# Patient Record
Sex: Male | Born: 1950 | Race: White | Hispanic: No | Marital: Married | Smoking: Never smoker
Health system: Southern US, Community
[De-identification: ages and names within clinical notes are randomized; demographics above are authoritative.]

## PROBLEM LIST (undated history)

## (undated) DIAGNOSIS — Z8719 Personal history of other diseases of the digestive system: Secondary | ICD-10-CM

## (undated) DIAGNOSIS — M1990 Unspecified osteoarthritis, unspecified site: Secondary | ICD-10-CM

## (undated) DIAGNOSIS — K219 Gastro-esophageal reflux disease without esophagitis: Secondary | ICD-10-CM

## (undated) DIAGNOSIS — K2270 Barrett's esophagus without dysplasia: Secondary | ICD-10-CM

## (undated) DIAGNOSIS — Z5189 Encounter for other specified aftercare: Secondary | ICD-10-CM

## (undated) HISTORY — DX: Barrett's esophagus without dysplasia: K22.70

## (undated) HISTORY — PX: FOOT SURGERY: SHX648

---

## 1998-04-04 ENCOUNTER — Other Ambulatory Visit: Admission: RE | Admit: 1998-04-04 | Discharge: 1998-04-04 | Payer: Self-pay

## 1998-09-20 ENCOUNTER — Other Ambulatory Visit: Admission: RE | Admit: 1998-09-20 | Discharge: 1998-09-20 | Payer: Self-pay | Admitting: Otolaryngology

## 1999-07-26 ENCOUNTER — Ambulatory Visit (HOSPITAL_COMMUNITY): Admission: RE | Admit: 1999-07-26 | Discharge: 1999-07-26 | Payer: Self-pay | Admitting: *Deleted

## 1999-07-26 ENCOUNTER — Encounter (INDEPENDENT_AMBULATORY_CARE_PROVIDER_SITE_OTHER): Payer: Self-pay | Admitting: Specialist

## 2000-09-23 ENCOUNTER — Ambulatory Visit (HOSPITAL_COMMUNITY): Admission: RE | Admit: 2000-09-23 | Discharge: 2000-09-23 | Payer: Self-pay | Admitting: *Deleted

## 2000-09-23 ENCOUNTER — Encounter (INDEPENDENT_AMBULATORY_CARE_PROVIDER_SITE_OTHER): Payer: Self-pay | Admitting: Specialist

## 2001-12-09 ENCOUNTER — Ambulatory Visit (HOSPITAL_COMMUNITY): Admission: RE | Admit: 2001-12-09 | Discharge: 2001-12-09 | Payer: Self-pay | Admitting: *Deleted

## 2001-12-09 ENCOUNTER — Encounter (INDEPENDENT_AMBULATORY_CARE_PROVIDER_SITE_OTHER): Payer: Self-pay

## 2003-10-27 ENCOUNTER — Encounter (INDEPENDENT_AMBULATORY_CARE_PROVIDER_SITE_OTHER): Payer: Self-pay | Admitting: Specialist

## 2003-10-27 ENCOUNTER — Ambulatory Visit (HOSPITAL_COMMUNITY): Admission: RE | Admit: 2003-10-27 | Discharge: 2003-10-27 | Payer: Self-pay | Admitting: *Deleted

## 2005-12-12 ENCOUNTER — Ambulatory Visit (HOSPITAL_COMMUNITY): Admission: RE | Admit: 2005-12-12 | Discharge: 2005-12-12 | Payer: Self-pay | Admitting: *Deleted

## 2005-12-12 ENCOUNTER — Encounter (INDEPENDENT_AMBULATORY_CARE_PROVIDER_SITE_OTHER): Payer: Self-pay | Admitting: Specialist

## 2006-07-23 ENCOUNTER — Encounter (INDEPENDENT_AMBULATORY_CARE_PROVIDER_SITE_OTHER): Payer: Self-pay | Admitting: *Deleted

## 2006-07-23 ENCOUNTER — Ambulatory Visit (HOSPITAL_COMMUNITY): Admission: RE | Admit: 2006-07-23 | Discharge: 2006-07-23 | Payer: Self-pay | Admitting: *Deleted

## 2007-01-28 ENCOUNTER — Ambulatory Visit (HOSPITAL_COMMUNITY): Admission: RE | Admit: 2007-01-28 | Discharge: 2007-01-29 | Payer: Self-pay | Admitting: Orthopedic Surgery

## 2007-02-01 ENCOUNTER — Ambulatory Visit: Payer: Self-pay | Admitting: Vascular Surgery

## 2007-02-05 ENCOUNTER — Emergency Department (HOSPITAL_COMMUNITY): Admission: EM | Admit: 2007-02-05 | Discharge: 2007-02-05 | Payer: Self-pay | Admitting: Emergency Medicine

## 2007-11-12 ENCOUNTER — Ambulatory Visit (HOSPITAL_COMMUNITY): Admission: RE | Admit: 2007-11-12 | Discharge: 2007-11-12 | Payer: Self-pay | Admitting: *Deleted

## 2007-11-12 ENCOUNTER — Encounter (INDEPENDENT_AMBULATORY_CARE_PROVIDER_SITE_OTHER): Payer: Self-pay | Admitting: *Deleted

## 2009-06-06 ENCOUNTER — Ambulatory Visit (HOSPITAL_COMMUNITY): Admission: RE | Admit: 2009-06-06 | Discharge: 2009-06-06 | Payer: Self-pay | Admitting: *Deleted

## 2009-06-06 ENCOUNTER — Encounter (INDEPENDENT_AMBULATORY_CARE_PROVIDER_SITE_OTHER): Payer: Self-pay | Admitting: *Deleted

## 2009-06-07 ENCOUNTER — Encounter: Admission: RE | Admit: 2009-06-07 | Discharge: 2009-06-07 | Payer: Self-pay | Admitting: *Deleted

## 2011-05-06 NOTE — Op Note (Signed)
NAME:  Reginald Small, Reginald Small NO.:  [id number]   MEDICAL RECORD NO.:  [id number]          PATIENT TYPE:  AMB   LOCATION:  ENDO                         FACILITY:  [facility]   PHYSICIAN:  Georgiana Spinner, M.D.    DATE OF BIRTH:  [birth year]   DATE OF PROCEDURE:  06/06/2009  DATE OF DISCHARGE:                               OPERATIVE REPORT   Dr. working Stan watts and [phone].   PROCEDURE:  Upper endoscopy.   INDICATIONS:  Barrett's esophagus with gastroesophageal reflux disease.   ANESTHESIA:  Fentanyl 100 mcg, Versed 10 mg.   PROCEDURE:  With the patient mildly sedated in the left lateral  decubitus position, the Pentax videoscopic endoscope was inserted in the  mouth, passed under direct vision through the esophagus which appeared  normal, until we reached distal esophagus and there were changes of  Barrett's photographed and biopsied.  We entered into stomach.  Fundus,  body, antrum, duodenal bulb, and second portion of duodenum were  visualized.  From this point the endoscope was slowly withdrawn taking  circumferential views of duodenal mucosa until the endoscope had been  pulled back into the stomach, placed in retroflexion to view the stomach  from below where a fundic gland polyp was seen, photographed and  biopsied.  The endoscope was straightened and withdrawn taking  circumferential views remaining gastric and esophageal mucosa.  The  patient's vital signs and pulse oximeter remained stable.  The patient  tolerated procedure well without apparent complication.   FINDINGS:  Barrett's esophagus biopsied.  Await biopsy report.  The  patient will call me for results and follow-up with me as an outpatient.  Fundic gland polyp also noted, again await biopsy report.           ______________________________  Georgiana Spinner, M.D.     GMO/MEDQ  D:  06/06/2009  T:  06/06/2009  Job:  [id number]   cc:   Oley Balm. Georgina Pillion, M.D.  Fax: [phone]

## 2011-05-06 NOTE — Op Note (Signed)
NAME:  Reginald Small, Reginald Small NO.:  [id number]   MEDICAL RECORD NO.:  [id number]          PATIENT TYPE:  AMB   LOCATION:  ENDO                         FACILITY:  [facility]   PHYSICIAN:  Georgiana Spinner, M.D.    DATE OF BIRTH:  12-03-1951   DATE OF PROCEDURE:  11/12/2007  DATE OF DISCHARGE:                               OPERATIVE REPORT   PROCEDURE:  Upper endoscopy.   INDICATIONS:  Barrett's esophagus.   ANESTHESIA:  Fentanyl 100 mcg, Versed 10 mg.   PROCEDURE:  With the patient mildly sedated in the left lateral  decubitus position, the Pentax videoscopic endoscope was inserted in the  mouth and passed under direct vision through the esophagus, which  appeared normal until we reached the distal esophagus and there was a  clear-cut area of Barrett's esophagus seen, photographed and biopsied,  and on further inspection there was a satellite area that was also  biopsied.  The endoscope was then advanced to the stomach.  Fundus,  body, antrum, duodenal bulb, second portion of duodenum all appeared  normal.  From this point the endoscope was slowly withdrawn taking  circumferential views of the duodenal mucosa until the endoscope had  been pulled back into the stomach, placed in retroflexion to view the  stomach from below, and there was a markedly loose wrap of the GE  junction around the endoscope indicating laxity of the lower esophageal  sphincter.  We could advance the endoscope on retroflexed view up into  the esophagus but chose not to do this.  The endoscope was then  straightened and withdrawn taking circumferential views of the remaining  gastric and esophageal mucosa.  The patient's vital signs and pulse  oximeter remained stable.  The patient tolerated the procedure well  without apparent complication.   FINDINGS:  Barrett's esophagus above a small hiatal hernia with  significant laxity of the lower esophageal sphincter.  No evidence of  NSAID-induced damage to  the mucosa.   IMPRESSION:  Barrett's esophagus.   PLAN:  Continue PPIs.  The patient may be on NSAIDs as necessary with  PPI coverage given the lack of findings of any NSAID damage.           ______________________________  Georgiana Spinner, M.D.     GMO/MEDQ  D:  11/12/2007  T:  11/12/2007  Job:  [id number]   cc:   Oley Balm. Georgina Pillion, M.D.  Fax: [phone]   Georges Lynch. Darrelyn Hillock, M.D.  Fax: [phone]

## 2011-05-09 NOTE — Op Note (Signed)
   NAME:  Reginald Small, Reginald Small                  ACCOUNT NO.:  [id number]   MEDICAL RECORD NO.:  [id number]                   PATIENT TYPE:  AMB   LOCATION:  ENDO                                 FACILITY:  [facility]   PHYSICIAN:  Georgiana Spinner, M.D.                 DATE OF BIRTH:  [birth year]   DATE OF PROCEDURE:  DATE OF DISCHARGE:                                 OPERATIVE REPORT   PROCEDURE:  Upper endoscopy with biopsy.   INDICATIONS FOR PROCEDURE:  Gastroesophageal reflux disease with Barrett's.   ANESTHESIA:  Demerol 50, Versed 10 mg.   DESCRIPTION OF PROCEDURE:  With the patient mildly sedated in the left  lateral decubitus position, the Olympus videoscopic endoscope was inserted  in the mouth and passed under direct vision through the esophagus which  showed Barrett's, photographs and biopsies taken. We entered into the  stomach. The fundus, body, antrum, duodenal bulb and second portion of the  duodenum all appeared normal. From this point, the endoscope was slowly  withdrawn taking circumferential views of the duodenal mucosa until the  endoscope was then pulled back into the stomach, placed in retroflexion to  view the stomach from below and a hiatal hernia was seen along with an  incomplete wrap of the GE junction around the endoscope and this was  photographed. The endoscope was then straightened and withdrawn taking  circumferential views of the remaining gastric and esophageal mucosa. The  patient's vital signs and pulse oximeter remained stable. The patient  tolerated the procedure well without apparent complications.   FINDINGS:  Hiatal hernia with Barrett's esophagus. Await biopsy report. The  patient will call me for results and followup with me as an outpatient.                                               Georgiana Spinner, M.D.    GMO/MEDQ  D:  10/27/2003  T:  10/27/2003  Job:  [id number]

## 2011-05-09 NOTE — Op Note (Signed)
NAME:  KEES, IDROVO NO.:  [id number]   MEDICAL RECORD NO.:  [id number]          PATIENT TYPE:  AMB   LOCATION:  ENDO                         FACILITY:  [facility]   PHYSICIAN:  Georgiana Spinner, M.D.    DATE OF BIRTH:  [birth year]   DATE OF PROCEDURE:  07/23/2006  DATE OF DISCHARGE:                                 OPERATIVE REPORT   PROCEDURE:  Upper endoscopy.   INDICATIONS:  Barrett's esophagus.   ANESTHESIA:  Demerol 75 mg, Versed 10 mg.   PROCEDURE:  With the patient mildly sedated in the left lateral decubitus  position, the Olympus videoscopic endoscope was inserted into the mouth,  passed under direct vision through the esophagus which appeared normal until  it reached the disdtal esophagus and there were changes of Barrett's.  Photographed and biopsied.  We went into the stomach.  Fundus, body, antrum,  duodenal bulb, second portion of the duodenum were visualized.  From this  point, the endoscope was slowly withdrawn, taking circumferential views of  the duodenal mucosa until the endoscope had been pulled back into the  stomach, placed in retroflexion to view the stomach from below.  The  endoscope was then straightened and withdrawn, taking several views of the  remaining gastric and esophgeal mucosa, stopping in the fundus of the  stomach where mutliple polyps were seen and biopsy specimens were obtained.  The patient's vital signs and pulse oximeter remained stable.  The patient  tolerated the procedure well without apparent complications.   FINDINGS:  Fundic gland polyps, biopsied; changes of Barrett's esophagus,  biopsied.  Await biopsy report.  The patient will call me for results and  follow up with me as an outpatient.           ______________________________  Georgiana Spinner, M.D.     GMO/MEDQ  D:  07/23/2006  T:  07/23/2006  Job:  [id number]

## 2011-05-09 NOTE — Op Note (Signed)
[facility]  Patient:    Reginald Small, Reginald Small Visit Number: [id number] MRN: [id number]          Service Type: END Location: ENDO Attending Physician:  Sabino Gasser Dictated by:   Sabino Gasser, M.D. Proc. Date: 12/09/01 Admit Date:  12/09/2001                             Operative Report  PROCEDURE:  Upper endoscopy.  INDICATIONS:  Barretts esophagus.  ANESTHESIA:  Demerol 75 mg, Versed 8 mg.  DESCRIPTION OF PROCEDURE:  With the patient mildly sedated in the left lateral decubitus position, the Olympus Videoscopic Endoscope was inserted into the mouth, passed under direct vision through the esophagus, which appeared normal, until we reached the distal most esophagus where a hiatal hernia was seen, and there were segments of short segment Barretts where the Z-line extended cephalad. At this point, we photographed that and biopsies were taken of this point. From this point, we advanced then into the stomach. Fundus, body, antrum, duodenal bulb, second portion of the duodenum were all well visualized. From this point, the endoscope was slowly withdrawn taking circumferential views of the entire duodenal mucosa until the endoscope was then pulled back into the stomach, placed in retroflexion view of the stomach from below, and hiatal hernia was once again visualized. The endoscope was then straightened, withdrawn, taking circumferential views of the remaining gastric and esophageal mucosa. The patient vital signs and pulse oximeter remained stable. The patient tolerated the procedure well without apparent complications.  FINDINGS:  Short segment Barretts esophagus biopsied.  PLAN:  Await biopsy report. The patient will call Dr. Virginia Rochester for results and follow up with Dr. Virginia Rochester as an outpatient. He is t continue present medication. ictated by:   Sabino Gasser, M.D. Attending Physician:  Sabino Gasser DD:  12/09/01 TD:  12/10/01 Job: [id number]

## 2011-05-09 NOTE — Op Note (Signed)
NAME:  Reginald Small, Reginald Small NO.:  [id number]   MEDICAL RECORD NO.:  [id number]          PATIENT TYPE:  AMB   LOCATION:  ENDO                         [facility]ACILITY:  [facility]   PHYSICIAN:  Georgiana Spinner, M.D.    DATE OF BIRTH:  [birth year]   DATE OF PROCEDURE:  12/12/2005  DATE OF DISCHARGE:                                 OPERATIVE REPORT   PROCEDURE:  Upper endoscopy.   INDICATIONS:  Barrett's esophagus.   ANESTHESIA:  Demerol 80, Versed 9 mg.   DESCRIPTION OF PROCEDURE:  With the patient mildly sedated in the left  lateral decubitus position, the Olympus videoscopic endoscope was inserted  in the mouth and passed under direct vision through the esophagus which  appeared normal until we reached the distal esophagus and there were changes  of Barrett's photographed and biopsied. We entered into the stomach. The  fundus, body, antrum, duodenal bulb, and second portion of duodenum were  visualized. From this point, the endoscope was slowly withdrawn taking  circumferential views of the duodenal mucosa until the endoscope had been  pulled back into the stomach, placed in retroflexion to view the stomach  from below. The endoscope was then straightened and withdrawn taking  circumferential views of the remaining gastric and esophageal mucosa. The  patient's vital signs and pulse oximeter remained stable. The patient  tolerated the procedure well without apparent complications. The only other  stop was in the antrum to biopsy erythema seen there.   PLAN:  Await biopsy reports. The patient will call me for results and follow-  up with me as an outpatient.           ______________________________  Georgiana Spinner, M.D.     GMO/MEDQ  D:  12/12/2005  T:  12/16/2005  Job:  [id number]

## 2011-05-09 NOTE — Procedures (Signed)
[facility]  Patient:    Reginald Small, Reginald Small               MRN: [id number] Proc. Date: 09/23/00 Adm. Date:  [id number] Attending:  Sabino Gasser                           Procedure Report  PROCEDURE:  Upper endoscopy with biopsy.  INDICATIONS:  Barretts esophagus.  ANESTHESIA:  Demerol 70 mg, Versed 10 mg.  DESCRIPTION OF PROCEDURE:  With the patient mildly sedated in the left lateral decubitus position, the Olympus videoscopic endoscope was inserted into the mouth and passed under direct vision through the esophagus where Barretts esophagus tissue was seen, photographed, and biopsied.  Once accomplished, the endoscope was advanced into the stomach.  Fundus, body, antrum, duodenal bulb, and second portion of the duodenum was well visualized and appeared normal. From this point, the endoscope was slowly withdrawn, taking circumferential views of the entire duodenal mucosa until the endoscope then pulled back in the stomach and placed in retroflexion to view the stomach from below, and a hiatal hernia was seen and photographed.  The endoscope was then straightened and pulled back from distal to proximal stomach taking circumferential views of the remaining gastric and subsequently esophageal mucosa which otherwise appeared normal.  The patients vital signs and pulse oximeter remained stable.  The patient tolerated the procedure well without apparent complications.  FINDINGS:  Barretts esophagus above the hiatal hernia.  PLAN: 1. Await biopsy report 2. The patient will continue present medications. 3. Follow up with me as an outpatient and for the results of biopsies. DD:  09/23/00 TD:  09/23/00 Job: [id number]

## 2011-05-09 NOTE — Op Note (Signed)
NAME:  Reginald Small, Reginald Small               ACCOUNT NO.:  [id number]   MEDICAL RECORD NO.:  [id number]          PATIENT TYPE:  AMB   LOCATION:  DAY                          FACILITY:  [facility]   PHYSICIAN:  Georges Lynch. Gioffre, M.D.DATE OF BIRTH:  05-Dec-1951   DATE OF PROCEDURE:  01/28/2007  DATE OF DISCHARGE:                               OPERATIVE REPORT   SURGEON:  Georges Lynch. Darrelyn Hillock, M.D.   ASSISTANT:  Marlowe Kays, M.D.   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   OPERATION:  1. Hemilaminectomy and microdiskectomy at L5-S1 on the right.  2. Foraminotomy L5-S1 right.   PROCEDURE:  Under general anesthesia with the patient on the spinal  frame a sterile prep and draping of the back was carried out.  He had 2  grams of Ancef IV.  At this time two needles were placed in the back for  localization purposes; and an x-ray was taken.  Incision then was made  over the L5-S1 interspace.  Bleeders were identified and cauterized.  The muscle then was stripped from the L5-S1 interspace.  Another  instrument was placed down into the space, x-ray was taken to verify the  position.  We then carried out hemilaminectomy in usual fashion at the  L5-S1 on the right.  At this time the ligamentum flavum was removed.  Great care taken not to injure the underlying dura.  We identified the  S1 root.  We gently retracted the root and immediately noted a large  extruded fragment in the canal. This was removed.  Following that we  cauterized the lateral recess veins.   We made a cruciate incision in the disk space and went down and  completed a microdiskectomy.  The microscope was used during the  procedure.  At this time the root and dura now was free.  We explored  the area and made sure there were no other fragments.  There were not.  We loosely applied some thrombin-soaked Gelfoam in the wound site and  closed the wound in layers in the usual fashion.  I left a small deep  distal portion of the wound open for drainage purposes.  The skin was  closed with metal staples, after I injected 20 mL of 1/2% Marcaine with  epinephrine into the wound site.  He also had 30 mg of Toradol IV.  Sterile dressing was applied.           ______________________________  Georges Lynch Darrelyn Hillock, M.D.     RAG/MEDQ  D:  01/28/2007  T:  01/29/2007  Job:  [id number]

## 2011-06-02 ENCOUNTER — Other Ambulatory Visit: Payer: Self-pay | Admitting: Gastroenterology

## 2012-01-10 ENCOUNTER — Emergency Department (HOSPITAL_COMMUNITY)
Admission: EM | Admit: 2012-01-10 | Discharge: 2012-01-10 | Disposition: A | Discharge status: Home / Self Care | Payer: Commercial Indemnity | Attending: Emergency Medicine | Admitting: Emergency Medicine

## 2012-01-10 ENCOUNTER — Encounter (HOSPITAL_COMMUNITY): Payer: Self-pay

## 2012-01-10 ENCOUNTER — Emergency Department (HOSPITAL_COMMUNITY): Payer: Commercial Indemnity

## 2012-01-10 DIAGNOSIS — M545 Low back pain, unspecified: Secondary | ICD-10-CM | POA: Insufficient documentation

## 2012-01-10 DIAGNOSIS — M549 Dorsalgia, unspecified: Secondary | ICD-10-CM

## 2012-01-10 DIAGNOSIS — R29818 Other symptoms and signs involving the nervous system: Secondary | ICD-10-CM | POA: Insufficient documentation

## 2012-01-10 DIAGNOSIS — M25569 Pain in unspecified knee: Secondary | ICD-10-CM | POA: Insufficient documentation

## 2012-01-10 DIAGNOSIS — M79609 Pain in unspecified limb: Secondary | ICD-10-CM | POA: Insufficient documentation

## 2012-01-10 DIAGNOSIS — R209 Unspecified disturbances of skin sensation: Secondary | ICD-10-CM | POA: Insufficient documentation

## 2012-01-10 DIAGNOSIS — R29898 Other symptoms and signs involving the musculoskeletal system: Secondary | ICD-10-CM | POA: Insufficient documentation

## 2012-01-10 HISTORY — DX: Unspecified osteoarthritis, unspecified site: M19.90

## 2012-01-10 LAB — POCT I-STAT, CHEM 8
Calcium, Ion: 1.2 mmol/L (ref 1.12–1.32)
Chloride: 108 mEq/L (ref 96–112)
Glucose, Bld: 95 mg/dL (ref 70–99)
Hemoglobin: 15.6 g/dL (ref 13.0–17.0)
Potassium: 4.3 mEq/L (ref 3.5–5.1)
TCO2: 25 mmol/L (ref 0–100)

## 2012-01-10 LAB — CBC
HCT: 43.8 % (ref 39.0–52.0)
Hemoglobin: 15 g/dL (ref 13.0–17.0)
MCH: 30.5 pg (ref 26.0–34.0)
MCHC: 34.2 g/dL (ref 30.0–36.0)
MCV: 89.2 fL (ref 78.0–100.0)
Platelets: 178 K/uL (ref 150–400)
RBC: 4.91 MIL/uL (ref 4.22–5.81)
RDW: 13.8 % (ref 11.5–15.5)
WBC: 5.9 K/uL (ref 4.0–10.5)

## 2012-01-10 MED ORDER — MORPHINE SULFATE 2 MG/ML IJ SOLN
INTRAMUSCULAR | Status: AC
  Filled 2012-01-10: qty 1

## 2012-01-10 MED ORDER — HYDROMORPHONE HCL PF 2 MG/ML IJ SOLN
2.0000 mg | Freq: Once | INTRAMUSCULAR | Status: AC
  Administered 2012-01-10: 2 mg via INTRAVENOUS
  Filled 2012-01-10: qty 1

## 2012-01-10 MED ORDER — OXYCODONE-ACETAMINOPHEN 5-325 MG PO TABS: 1.0000 | ORAL_TABLET | Freq: Four times a day (QID) | ORAL | Status: AC | PRN

## 2012-01-10 MED ORDER — MORPHINE SULFATE 2 MG/ML IJ SOLN
2.0000 mg | Freq: Once | INTRAMUSCULAR | Status: AC
  Administered 2012-01-10: 2 mg via INTRAVENOUS

## 2012-01-10 MED ORDER — DEXAMETHASONE SODIUM PHOSPHATE 4 MG/ML IJ SOLN
12.0000 mg | Freq: Once | INTRAMUSCULAR | Status: AC
  Administered 2012-01-10: 12 mg via INTRAVENOUS
  Filled 2012-01-10: qty 3

## 2012-01-10 MED ORDER — SODIUM CHLORIDE 0.9 % IV SOLN
Freq: Once | INTRAVENOUS | Status: AC
  Administered 2012-01-10: 10:00:00 via INTRAVENOUS

## 2012-01-10 MED ORDER — METHYLPREDNISOLONE 4 MG PO KIT: PACK | ORAL | Status: DC

## 2012-01-10 MED ORDER — HYDROMORPHONE HCL PF 1 MG/ML IJ SOLN
1.0000 mg | Freq: Once | INTRAMUSCULAR | Status: AC
  Administered 2012-01-10: 1 mg via INTRAVENOUS
  Filled 2012-01-10: qty 1

## 2012-01-10 MED ORDER — GADOBENATE DIMEGLUMINE 529 MG/ML IV SOLN
20.0000 mL | Freq: Once | INTRAVENOUS | Status: AC | PRN
  Administered 2012-01-10: 20 mL via INTRAVENOUS

## 2012-01-10 NOTE — ED Provider Notes (Signed)
Pt states he would rather go home instead of be admitted for pain control.  Plan to give Diluadid prior to d/c.  Will give medrol dose pak, percocet for sx relief.  Advised follow up with [facility] on Monday am.  Pt and wife compliant with care.  Lindley Magnus [place], [place] 01/10/12 1308

## 2012-01-10 NOTE — ED Provider Notes (Signed)
History     CSN: [id number]  Arrival date & time 01/10/12  7829   First MD Initiated Contact with Patient 01/10/12 0840      Chief Complaint  Patient presents with  . Leg Pain    (Consider location/radiation/quality/duration/timing/severity/associated sxs/prior treatment) Patient is a 61 y.o. male presenting with leg pain and back pain.  Leg Pain  Associated symptoms include numbness and tingling.  Back Pain  This is a new problem. The current episode started yesterday. The problem occurs constantly. The problem has been rapidly worsening. The pain is associated with no known injury. The pain is present in the lumbar spine. The quality of the pain is described as stabbing and shooting. The pain radiates to the left thigh, left knee and left foot. The pain is at a severity of 10/10. The pain is severe. The symptoms are aggravated by bending, twisting and certain positions. Associated symptoms include numbness, leg pain, tingling and weakness. Pertinent negatives include no chest pain, no fever, no weight loss, no headaches, no abdominal pain, no abdominal swelling, no bowel incontinence, no perianal numbness, no bladder incontinence, no dysuria, no pelvic pain, no paresthesias and no paresis. He has tried bed rest for the symptoms. The treatment provided no relief.   Patient has h/o herniated disc, surgery by Dr. Netta Corrigan 5 years ago.  Patient recently had left bunionectomy last month.  He notes he is recovered without problems.  He did recently go on plane ride to [place].  After pulling bag through the airport and recent bunion surgery, pt started having severe back pain radiating to his left foot.  He has been unable to get dressed.  He notes that having BM is extremely painful.   Past Medical History  Diagnosis Date  . Arthritis     Past Surgical History  Procedure Date  . Back surgery   . Shoulder fusion     lt. ac joint shoulder surgery  . Foot surgery bunion , rt    No family  history on file.  History  Substance Use Topics  . Smoking status: Never Smoker   . Smokeless tobacco: Not on file  . Alcohol Use: No      Review of Systems  Constitutional: Negative for fever and weight loss.  Cardiovascular: Negative for chest pain.  Gastrointestinal: Negative for abdominal pain and bowel incontinence.  Genitourinary: Negative for bladder incontinence, dysuria and pelvic pain.  Musculoskeletal: Positive for back pain.  Neurological: Positive for tingling, weakness and numbness. Negative for headaches and paresthesias.  All other systems reviewed and are negative.    Allergies  Review of patient's allergies indicates no known allergies.  Home Medications   Current Outpatient Rx  Name Route Sig Dispense Refill  . LANSOPRAZOLE 15 MG PO CPDR Oral Take 15 mg by mouth daily.      BP 116/76  Pulse 75  Temp(Src) 98.1 F (36.7 C) (Oral)  Resp 20  Ht 6' (1.829 m)  Wt 192 lb (87.091 kg)  BMI 26.04 kg/m2  SpO2 100%  Physical Exam  Constitutional: He is oriented to person, place, and time. He appears well-developed and well-nourished.  HENT:  Head: Normocephalic and atraumatic.  Eyes: Conjunctivae and EOM are normal. Pupils are equal, round, and reactive to light.  Neck: Normal range of motion. Neck supple.  Cardiovascular: Normal rate and regular rhythm.   Pulmonary/Chest: Effort normal and breath sounds normal.  Abdominal: Soft. Bowel sounds are normal. He exhibits no distension. There is no tenderness.  There is no rebound.  Musculoskeletal: Normal range of motion. He exhibits no edema.       Pt has FROM of bilateral lower and upper extremities.  He has 5/5 strength throughout.  He is in extreme pain with movement, bending, and extension/flexion of left lower extremity.  He states pain is sharp, stabbing, and painful on localized to the left LE.  Neurological: He is alert and oriented to person, place, and time. He has normal reflexes. He displays normal  reflexes. A cranial nerve deficit is present. He exhibits normal muscle tone. Coordination normal.  Skin: Skin is warm and dry. No rash noted. No erythema. No pallor.  Psychiatric: He has a normal mood and affect. His behavior is normal.    ED Course  Procedures (including critical care time)   Labs Reviewed  CBC  I-STAT, CHEM 8  PROTIME-INR   No results found.   No diagnosis found.    MDM  Due to history of herniated disc and severe pain, I've ordered a MRI of spine for further eval and basic labs.  Pt given Diluadid for sx relief.  Will continue to monitor.  Moved to CDU Obs.  MRI showed bulging disc with nerve encroachment at L5.  Consulted Dr. Shon Baton who advised Decadron 12mg  IV x1 for pain control along with percocet at home.  If pain continues to remain excruciating, then plan for admission for pain control.  Patient and wife aware of current plan.  Madelaine Bhat, [place] 01/10/12 [address] [place], [place] 01/10/12 [phone]

## 2012-01-10 NOTE — ED Notes (Signed)
Pt. Is experiencing lt. Upper leg pain that radiates into his lt. Calf. Pain increases with movement.  Denies any injuries.

## 2012-01-11 NOTE — ED Provider Notes (Signed)
Medical screening examination/treatment/procedure(s) were performed by non-physician practitioner and as supervising physician I was immediately available for consultation/collaboration.  Sherron Mapp T Attilio Zeitler, MD 01/11/12 0751 

## 2012-01-11 NOTE — ED Provider Notes (Signed)
Medical screening examination/treatment/procedure(s) were performed by non-physician practitioner and as supervising physician I was immediately available for consultation/collaboration.  Eyal Greenhaw T Tate Jerkins, MD 01/11/12 0751 

## 2012-01-22 ENCOUNTER — Other Ambulatory Visit: Payer: Self-pay | Admitting: Physical Medicine and Rehabilitation

## 2012-01-22 DIAGNOSIS — M79605 Pain in left leg: Secondary | ICD-10-CM

## 2012-01-22 DIAGNOSIS — M549 Dorsalgia, unspecified: Secondary | ICD-10-CM

## 2012-01-26 ENCOUNTER — Telehealth: Payer: Self-pay

## 2012-01-26 ENCOUNTER — Inpatient Hospital Stay: Admission: RE | Admit: 2012-01-26 | Payer: Commercial Indemnity | Source: Ambulatory Visit

## 2012-01-26 ENCOUNTER — Ambulatory Visit
Admission: RE | Admit: 2012-01-26 | Discharge: 2012-01-26 | Disposition: A | Discharge status: Home / Self Care | Payer: Commercial Indemnity | Source: Ambulatory Visit | Attending: Physical Medicine and Rehabilitation | Admitting: Physical Medicine and Rehabilitation

## 2012-01-26 ENCOUNTER — Other Ambulatory Visit: Payer: Commercial Indemnity

## 2012-01-26 DIAGNOSIS — M549 Dorsalgia, unspecified: Secondary | ICD-10-CM

## 2012-01-26 DIAGNOSIS — M79605 Pain in left leg: Secondary | ICD-10-CM

## 2012-01-26 MED ORDER — HYDROMORPHONE HCL PF 2 MG/ML IJ SOLN
2.0000 mg | Freq: Once | INTRAMUSCULAR | Status: AC
  Administered 2012-01-26: 2 mg via INTRAMUSCULAR

## 2012-01-26 MED ORDER — ONDANSETRON HCL 8 MG PO TABS
8.0000 mg | ORAL_TABLET | Freq: Once | ORAL | Status: AC
  Administered 2012-01-26: 8 mg via ORAL

## 2012-01-26 MED ORDER — ONDANSETRON HCL 4 MG/2ML IJ SOLN: 4.0000 mg | Freq: Four times a day (QID) | INTRAMUSCULAR | Status: DC | PRN

## 2012-01-26 MED ORDER — IOHEXOL 180 MG/ML  SOLN
15.0000 mL | Freq: Once | INTRAMUSCULAR | Status: AC | PRN
  Administered 2012-01-26: 15 mL via INTRATHECAL

## 2012-01-26 MED ORDER — IOHEXOL 300 MG/ML  SOLN: 10.0000 mL | Freq: Once | INTRAMUSCULAR | Status: AC | PRN

## 2012-01-26 MED ORDER — DIAZEPAM 5 MG PO TABS
10.0000 mg | ORAL_TABLET | Freq: Once | ORAL | Status: AC
  Administered 2012-01-26: 10 mg via ORAL

## 2012-01-26 NOTE — Progress Notes (Signed)
Patient states he has been off his Promethazine for the past two days, and he is nauseous.  jkl

## 2012-01-28 ENCOUNTER — Other Ambulatory Visit: Payer: Self-pay | Admitting: Otolaryngology

## 2012-01-28 ENCOUNTER — Encounter (HOSPITAL_COMMUNITY): Payer: Self-pay | Admitting: Pharmacy Technician

## 2012-01-29 ENCOUNTER — Ambulatory Visit (HOSPITAL_COMMUNITY)
Admission: RE | Admit: 2012-01-29 | Discharge: 2012-01-29 | Disposition: A | Discharge status: Home / Self Care | Payer: Managed Care, Other (non HMO) | Source: Ambulatory Visit | Attending: Otolaryngology | Admitting: Otolaryngology

## 2012-01-29 ENCOUNTER — Encounter (HOSPITAL_COMMUNITY)
Admission: RE | Admit: 2012-01-29 | Discharge: 2012-01-29 | Disposition: A | Discharge status: Home / Self Care | Payer: Managed Care, Other (non HMO) | Source: Ambulatory Visit | Attending: Specialist | Admitting: Specialist

## 2012-01-29 ENCOUNTER — Encounter (HOSPITAL_COMMUNITY): Payer: Self-pay

## 2012-01-29 DIAGNOSIS — M51379 Other intervertebral disc degeneration, lumbosacral region without mention of lumbar back pain or lower extremity pain: Secondary | ICD-10-CM | POA: Insufficient documentation

## 2012-01-29 DIAGNOSIS — M5137 Other intervertebral disc degeneration, lumbosacral region: Secondary | ICD-10-CM | POA: Insufficient documentation

## 2012-01-29 DIAGNOSIS — M1990 Unspecified osteoarthritis, unspecified site: Secondary | ICD-10-CM

## 2012-01-29 DIAGNOSIS — K219 Gastro-esophageal reflux disease without esophagitis: Secondary | ICD-10-CM

## 2012-01-29 DIAGNOSIS — Z01812 Encounter for preprocedural laboratory examination: Secondary | ICD-10-CM | POA: Insufficient documentation

## 2012-01-29 DIAGNOSIS — Z8719 Personal history of other diseases of the digestive system: Secondary | ICD-10-CM

## 2012-01-29 DIAGNOSIS — Z5189 Encounter for other specified aftercare: Secondary | ICD-10-CM

## 2012-01-29 HISTORY — DX: Personal history of other diseases of the digestive system: Z87.19

## 2012-01-29 HISTORY — PX: BACK SURGERY: SHX140

## 2012-01-29 HISTORY — PX: TONSILLECTOMY: SUR1361

## 2012-01-29 HISTORY — PX: SHOULDER FUSION: SUR625

## 2012-01-29 HISTORY — DX: Encounter for other specified aftercare: Z51.89

## 2012-01-29 HISTORY — DX: Gastro-esophageal reflux disease without esophagitis: K21.9

## 2012-01-29 HISTORY — PX: NASAL SINUS SURGERY: SHX719

## 2012-01-29 HISTORY — DX: Unspecified osteoarthritis, unspecified site: M19.90

## 2012-01-29 LAB — SURGICAL PCR SCREEN
MRSA, PCR: NEGATIVE
Staphylococcus aureus: NEGATIVE

## 2012-01-29 MED ORDER — CHLORHEXIDINE GLUCONATE 4 % EX LIQD
60.0000 mL | Freq: Once | CUTANEOUS | Status: DC
  Filled 2012-01-29: qty 60

## 2012-01-29 NOTE — Patient Instructions (Signed)
20 Reginald Small  01/29/2012   Your procedure is scheduled on: 02-05-12  Report to [facility] at    08:00    AM.  Call this number if you have problems the morning of surgery: [phone]   Remember:   Do not eat food:After Midnight.    Take these medicines the morning of surgery with A SIP OF WATER: Prevacid, Morphine, Percocet, Promethazine   Do not wear jewelry, make-up or nail polish.  Do not wear lotions, powders, or perfumes. You may wear deodorant.  Do not shave 48 hours prior to surgery.(shave neck and face only)  Do not bring valuables to the hospital.  Contacts, dentures or bridgework may not be worn into surgery.  Leave suitcase in the car. After surgery it may be brought to your room.  For patients admitted to the hospital, checkout time is 11:00 AM the day of discharge.   Patients discharged the day of surgery will not be allowed to drive home.  Name and phone number of your driver: spouse  Special Instructions: CHG Shower Use Special Wash: 1/2 bottle night before surgery and 1/2 bottle morning of surgery.   Please read over the following fact sheets that you were given: MRSA Information, Incentive Spirometry Instructions

## 2012-02-05 ENCOUNTER — Ambulatory Visit (HOSPITAL_COMMUNITY): Payer: Managed Care, Other (non HMO)

## 2012-02-05 ENCOUNTER — Ambulatory Visit (HOSPITAL_COMMUNITY): Payer: Managed Care, Other (non HMO) | Admitting: Registered Nurse

## 2012-02-05 ENCOUNTER — Other Ambulatory Visit: Payer: Self-pay | Admitting: Specialist

## 2012-02-05 ENCOUNTER — Encounter (HOSPITAL_COMMUNITY): Payer: Self-pay | Admitting: *Deleted

## 2012-02-05 ENCOUNTER — Observation Stay (HOSPITAL_COMMUNITY)
Admission: RE | Admit: 2012-02-05 | Discharge: 2012-02-05 | Disposition: A | Discharge status: Home / Self Care | Payer: Managed Care, Other (non HMO) | Source: Ambulatory Visit | Attending: Specialist | Admitting: Specialist

## 2012-02-05 ENCOUNTER — Encounter (HOSPITAL_COMMUNITY): Payer: Self-pay | Admitting: Registered Nurse

## 2012-02-05 ENCOUNTER — Encounter (HOSPITAL_COMMUNITY)
Admission: RE | Disposition: A | Discharge status: Home / Self Care | Payer: Self-pay | Source: Ambulatory Visit | Attending: Specialist

## 2012-02-05 DIAGNOSIS — Z79899 Other long term (current) drug therapy: Secondary | ICD-10-CM | POA: Insufficient documentation

## 2012-02-05 DIAGNOSIS — M5126 Other intervertebral disc displacement, lumbar region: Principal | ICD-10-CM | POA: Insufficient documentation

## 2012-02-05 DIAGNOSIS — K219 Gastro-esophageal reflux disease without esophagitis: Secondary | ICD-10-CM | POA: Insufficient documentation

## 2012-02-05 HISTORY — PX: LUMBAR LAMINECTOMY/DECOMPRESSION MICRODISCECTOMY: SHX5026

## 2012-02-05 SURGERY — LUMBAR LAMINECTOMY/DECOMPRESSION MICRODISCECTOMY
Anesthesia: General | Site: Back | Laterality: Left | Wound class: Clean

## 2012-02-05 MED ORDER — LIDOCAINE HCL (CARDIAC) 20 MG/ML IV SOLN
INTRAVENOUS | Status: DC | PRN
  Administered 2012-02-05: 100 mg via INTRAVENOUS

## 2012-02-05 MED ORDER — SODIUM CHLORIDE 0.9 % IV SOLN: 250.0000 mL | INTRAVENOUS | Status: DC

## 2012-02-05 MED ORDER — FENTANYL CITRATE 0.05 MG/ML IJ SOLN: 25.0000 ug | INTRAMUSCULAR | Status: DC | PRN

## 2012-02-05 MED ORDER — SODIUM CHLORIDE 0.9 % IV SOLN: INTRAVENOUS | Status: DC

## 2012-02-05 MED ORDER — MORPHINE SULFATE 15 MG PO TABS: 15.0000 mg | ORAL_TABLET | ORAL | Status: DC | PRN

## 2012-02-05 MED ORDER — SUCCINYLCHOLINE CHLORIDE 20 MG/ML IJ SOLN
INTRAMUSCULAR | Status: DC | PRN
  Administered 2012-02-05: 100 mg via INTRAVENOUS

## 2012-02-05 MED ORDER — OXYCODONE-ACETAMINOPHEN 5-325 MG PO TABS
1.0000 | ORAL_TABLET | ORAL | Status: DC | PRN
  Administered 2012-02-05: 1 via ORAL
  Filled 2012-02-05: qty 1

## 2012-02-05 MED ORDER — DOCUSATE SODIUM 100 MG PO CAPS
100.0000 mg | ORAL_CAPSULE | Freq: Two times a day (BID) | ORAL | Status: DC
  Filled 2012-02-05 (×3): qty 1

## 2012-02-05 MED ORDER — PANTOPRAZOLE SODIUM 20 MG PO TBEC
20.0000 mg | DELAYED_RELEASE_TABLET | Freq: Every day | ORAL | Status: DC
  Administered 2012-02-05: 20 mg via ORAL
  Filled 2012-02-05 (×2): qty 1

## 2012-02-05 MED ORDER — ACETAMINOPHEN 650 MG RE SUPP: 650.0000 mg | RECTAL | Status: DC | PRN

## 2012-02-05 MED ORDER — SODIUM CHLORIDE 0.9 % IJ SOLN: 3.0000 mL | Freq: Two times a day (BID) | INTRAMUSCULAR | Status: DC

## 2012-02-05 MED ORDER — HYDROMORPHONE HCL PF 1 MG/ML IJ SOLN
0.5000 mg | INTRAMUSCULAR | Status: DC | PRN
  Administered 2012-02-05: 1 mg via INTRAVENOUS
  Administered 2012-02-05: 0.5 mg via INTRAVENOUS
  Filled 2012-02-05: qty 1

## 2012-02-05 MED ORDER — GLYCOPYRROLATE 0.2 MG/ML IJ SOLN
INTRAMUSCULAR | Status: DC | PRN
  Administered 2012-02-05: .6 mg via INTRAVENOUS

## 2012-02-05 MED ORDER — SODIUM CHLORIDE 0.9 % IJ SOLN: 3.0000 mL | INTRAMUSCULAR | Status: DC | PRN

## 2012-02-05 MED ORDER — ACETAMINOPHEN 325 MG PO TABS: 650.0000 mg | ORAL_TABLET | ORAL | Status: DC | PRN

## 2012-02-05 MED ORDER — ACETAMINOPHEN 10 MG/ML IV SOLN
INTRAVENOUS | Status: DC | PRN
  Administered 2012-02-05: 1000 mg via INTRAVENOUS

## 2012-02-05 MED ORDER — NEOSTIGMINE METHYLSULFATE 1 MG/ML IJ SOLN
INTRAMUSCULAR | Status: DC | PRN
  Administered 2012-02-05: 4 mg via INTRAVENOUS

## 2012-02-05 MED ORDER — HYDROMORPHONE HCL PF 1 MG/ML IJ SOLN
0.2500 mg | INTRAMUSCULAR | Status: DC | PRN
  Administered 2012-02-05 (×4): 0.5 mg via INTRAVENOUS

## 2012-02-05 MED ORDER — MENTHOL 3 MG MT LOZG: 1.0000 | LOZENGE | OROMUCOSAL | Status: DC | PRN

## 2012-02-05 MED ORDER — CEFAZOLIN SODIUM 1-5 GM-% IV SOLN
1.0000 g | Freq: Three times a day (TID) | INTRAVENOUS | Status: DC
  Filled 2012-02-05 (×2): qty 50

## 2012-02-05 MED ORDER — PROPOFOL 10 MG/ML IV EMUL
INTRAVENOUS | Status: DC | PRN
  Administered 2012-02-05: 200 mg via INTRAVENOUS

## 2012-02-05 MED ORDER — PROMETHAZINE HCL 25 MG/ML IJ SOLN: 6.2500 mg | INTRAMUSCULAR | Status: DC | PRN

## 2012-02-05 MED ORDER — DEXAMETHASONE SODIUM PHOSPHATE 10 MG/ML IJ SOLN
INTRAMUSCULAR | Status: DC | PRN
  Administered 2012-02-05: 10 mg via INTRAVENOUS

## 2012-02-05 MED ORDER — HYDROMORPHONE HCL PF 1 MG/ML IJ SOLN
INTRAMUSCULAR | Status: AC
  Administered 2012-02-05: 0.5 mg via INTRAVENOUS
  Filled 2012-02-05: qty 1

## 2012-02-05 MED ORDER — FENTANYL CITRATE 0.05 MG/ML IJ SOLN
INTRAMUSCULAR | Status: DC | PRN
  Administered 2012-02-05: 100 ug via INTRAVENOUS
  Administered 2012-02-05: 50 ug via INTRAVENOUS
  Administered 2012-02-05: 100 ug via INTRAVENOUS

## 2012-02-05 MED ORDER — PROMETHAZINE HCL 25 MG PO TABS: 12.5000 mg | ORAL_TABLET | Freq: Four times a day (QID) | ORAL | Status: DC | PRN

## 2012-02-05 MED ORDER — CEFAZOLIN SODIUM-DEXTROSE 2-3 GM-% IV SOLR
2.0000 g | INTRAVENOUS | Status: AC
  Administered 2012-02-05: 2 g via INTRAVENOUS

## 2012-02-05 MED ORDER — ZOLPIDEM TARTRATE 10 MG PO TABS: 10.0000 mg | ORAL_TABLET | Freq: Every day | ORAL | Status: DC

## 2012-02-05 MED ORDER — ONDANSETRON HCL 4 MG/2ML IJ SOLN
INTRAMUSCULAR | Status: DC | PRN
  Administered 2012-02-05: 4 mg via INTRAVENOUS

## 2012-02-05 MED ORDER — BUPIVACAINE-EPINEPHRINE 0.5% -1:200000 IJ SOLN
INTRAMUSCULAR | Status: DC | PRN
  Administered 2012-02-05: 50 mL

## 2012-02-05 MED ORDER — OXYCODONE-ACETAMINOPHEN 5-325 MG PO TABS: 1.0000 | ORAL_TABLET | ORAL | Status: DC | PRN

## 2012-02-05 MED ORDER — METHOCARBAMOL 100 MG/ML IJ SOLN
500.0000 mg | Freq: Four times a day (QID) | INTRAMUSCULAR | Status: DC | PRN
  Administered 2012-02-05: 500 mg via INTRAVENOUS
  Filled 2012-02-05: qty 5

## 2012-02-05 MED ORDER — ONDANSETRON HCL 4 MG/2ML IJ SOLN: 4.0000 mg | INTRAMUSCULAR | Status: DC | PRN

## 2012-02-05 MED ORDER — LACTATED RINGERS IV SOLN
INTRAVENOUS | Status: DC
  Administered 2012-02-05 (×3): via INTRAVENOUS

## 2012-02-05 MED ORDER — THROMBIN 5000 UNITS EX SOLR
CUTANEOUS | Status: DC | PRN
  Administered 2012-02-05: 5000 [IU] via TOPICAL

## 2012-02-05 MED ORDER — MIDAZOLAM HCL 5 MG/5ML IJ SOLN
INTRAMUSCULAR | Status: DC | PRN
  Administered 2012-02-05: 2 mg via INTRAVENOUS

## 2012-02-05 MED ORDER — ROCURONIUM BROMIDE 100 MG/10ML IV SOLN
INTRAVENOUS | Status: DC | PRN
  Administered 2012-02-05: 20 mg via INTRAVENOUS

## 2012-02-05 MED ORDER — SODIUM CHLORIDE 0.9 % IR SOLN
Status: DC | PRN
  Administered 2012-02-05: 10:00:00

## 2012-02-05 MED ORDER — METHOCARBAMOL 500 MG PO TABS: 500.0000 mg | ORAL_TABLET | Freq: Four times a day (QID) | ORAL | Status: DC | PRN

## 2012-02-05 MED ORDER — PHENOL 1.4 % MT LIQD: 1.0000 | OROMUCOSAL | Status: DC | PRN

## 2012-02-05 SURGICAL SUPPLY — 50 items
BAG ZIPLOCK 12X15 (MISCELLANEOUS) ×2 IMPLANT
BENZOIN TINCTURE PRP APPL 2/3 (GAUZE/BANDAGES/DRESSINGS) ×4 IMPLANT
CHLORAPREP W/TINT 26ML (MISCELLANEOUS) IMPLANT
CLEANER TIP ELECTROSURG 2X2 (MISCELLANEOUS) ×2 IMPLANT
CLOSURE STERI STRIP 1/2 X4 (GAUZE/BANDAGES/DRESSINGS) ×2 IMPLANT
CLOTH BEACON ORANGE TIMEOUT ST (SAFETY) ×2 IMPLANT
DECANTER SPIKE VIAL GLASS SM (MISCELLANEOUS) ×2 IMPLANT
DRAPE MICROSCOPE LEICA (MISCELLANEOUS) ×2 IMPLANT
DRAPE POUCH INSTRU U-SHP 10X18 (DRAPES) ×2 IMPLANT
DRAPE SURG 17X11 SM STRL (DRAPES) ×2 IMPLANT
DRSG EMULSION OIL 3X3 NADH (GAUZE/BANDAGES/DRESSINGS) IMPLANT
DRSG PAD ABDOMINAL 8X10 ST (GAUZE/BANDAGES/DRESSINGS) IMPLANT
DRSG TELFA 4X14 ISLAND ADH (GAUZE/BANDAGES/DRESSINGS) ×2 IMPLANT
DRSG TELFA 4X5 ISLAND ADH (GAUZE/BANDAGES/DRESSINGS) IMPLANT
DURAPREP 26ML APPLICATOR (WOUND CARE) ×2 IMPLANT
ELECT REM PT RETURN 9FT ADLT (ELECTROSURGICAL) ×2
ELECTRODE REM PT RTRN 9FT ADLT (ELECTROSURGICAL) ×1 IMPLANT
GLOVE BIOGEL PI IND STRL 6.5 (GLOVE) ×1 IMPLANT
GLOVE BIOGEL PI IND STRL 8 (GLOVE) ×1 IMPLANT
GLOVE BIOGEL PI INDICATOR 6.5 (GLOVE) ×1
GLOVE BIOGEL PI INDICATOR 8 (GLOVE) ×1
GLOVE ECLIPSE 6.5 STRL STRAW (GLOVE) ×2 IMPLANT
GLOVE INDICATOR 6.5 STRL GRN (GLOVE) ×2 IMPLANT
GLOVE SURG SS PI 8.0 STRL IVOR (GLOVE) ×4 IMPLANT
GOWN PREVENTION PLUS LG XLONG (DISPOSABLE) ×2 IMPLANT
GOWN STRL REIN XL XLG (GOWN DISPOSABLE) ×2 IMPLANT
KIT BASIN OR (CUSTOM PROCEDURE TRAY) ×2 IMPLANT
KIT POSITIONING SURG ANDREWS (MISCELLANEOUS) ×2 IMPLANT
MANIFOLD NEPTUNE II (INSTRUMENTS) ×2 IMPLANT
NEEDLE SPNL 18GX3.5 QUINCKE PK (NEEDLE) ×6 IMPLANT
PATTIES SURGICAL .5 X.5 (GAUZE/BANDAGES/DRESSINGS) IMPLANT
PATTIES SURGICAL .75X.75 (GAUZE/BANDAGES/DRESSINGS) IMPLANT
PATTIES SURGICAL 1X1 (DISPOSABLE) IMPLANT
SPONGE SURGIFOAM ABS GEL 100 (HEMOSTASIS) ×2 IMPLANT
STAPLER VISISTAT (STAPLE) IMPLANT
STRIP CLOSURE SKIN 1/2X4 (GAUZE/BANDAGES/DRESSINGS) IMPLANT
SUT PROLENE 3 0 PS 2 (SUTURE) ×2 IMPLANT
SUT VIC AB 0 CT1 27 (SUTURE) ×1
SUT VIC AB 0 CT1 27XBRD ANTBC (SUTURE) ×1 IMPLANT
SUT VIC AB 1 CT1 27 (SUTURE) ×1
SUT VIC AB 1 CT1 27XBRD ANTBC (SUTURE) ×1 IMPLANT
SUT VIC AB 1-0 CT2 27 (SUTURE) IMPLANT
SUT VIC AB 2-0 CT1 27 (SUTURE) ×1
SUT VIC AB 2-0 CT1 TAPERPNT 27 (SUTURE) ×1 IMPLANT
SUT VIC AB 2-0 CT2 27 (SUTURE) ×4 IMPLANT
SUT VICRYL 0 27 CT2 27 ABS (SUTURE) ×4 IMPLANT
SUT VICRYL 0 UR6 27IN ABS (SUTURE) IMPLANT
SYRINGE 10CC LL (SYRINGE) ×4 IMPLANT
TRAY LAMINECTOMY (CUSTOM PROCEDURE TRAY) ×2 IMPLANT
YANKAUER SUCT BULB TIP NO VENT (SUCTIONS) ×2 IMPLANT

## 2012-02-05 NOTE — Plan of Care (Signed)
Problem: Diagnosis - Type of Surgery Goal: General Surgical Patient Education (See Patient Education module for education specifics) Lumbar laminectomy microdiscectomy left
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
## 2012-02-05 NOTE — Anesthesia Postprocedure Evaluation (Signed)
  Anesthesia Post-op Note  Patient: Reginald Small  Procedure(s) Performed: Procedure(s) (LRB): LUMBAR LAMINECTOMY/DECOMPRESSION MICRODISCECTOMY (Left)  Patient Location: PACU  Anesthesia Type: General  Level of Consciousness: awake and alert   Airway and Oxygen Therapy: Patient Spontanous Breathing  Post-op Pain: mild  Post-op Assessment: Post-op Vital signs reviewed, Patient's Cardiovascular Status Stable, Respiratory Function Stable, Patent Airway and No signs of Nausea or vomiting  Post-op Vital Signs: stable  Complications: No apparent anesthesia complications. Pain difficult to control. Dilaudid, Robaxin, Fentanyl PRN

## 2012-02-05 NOTE — Transfer of Care (Signed)
Immediate Anesthesia Transfer of Care Note  Patient: Reginald Small  Procedure(s) Performed: Procedure(s) (LRB): LUMBAR LAMINECTOMY/DECOMPRESSION MICRODISCECTOMY (Left)  Patient Location: PACU  Anesthesia Type: General  Level of Consciousness: awake, sedated and patient cooperative  Airway & Oxygen Therapy: Patient Spontanous Breathing and Patient connected to face mask oxygen  Post-op Assessment: Report given to PACU RN and Post -op Vital signs reviewed and stable  Post vital signs: Reviewed and stable  Complications: No apparent anesthesia complications

## 2012-02-05 NOTE — Brief Op Note (Signed)
02/05/2012  10:44 AM  PATIENT:  Reginald Small  61 y.o. male  PRE-OPERATIVE DIAGNOSIS:  Herniated Disc Lumbar 4 - Lumbar 5  POST-OPERATIVE DIAGNOSIS:  Herniated Disc Lumbar 4 - Lumbar 5  PROCEDURE:  Procedure(s) (LRB): LUMBAR LAMINECTOMY/DECOMPRESSION MICRODISCECTOMY (Left)  SURGEON:  Surgeon(s) and Role:    * Javier Docker, MD - Primary  PHYSICIAN ASSISTANT:   ASSISTANTS: strader   ANESTHESIA:   general  EBL:  Total I/O In: 2000 [I.V.:2000] Out: 100 [Urine:100]  BLOOD ADMINISTERED:none  DRAINS: none   LOCAL MEDICATIONS USED:  MARCAINE     SPECIMEN:  Source of Specimen:  L45 disc  DISPOSITION OF SPECIMEN:  PATHOLOGY  COUNTS:  YES  TOURNIQUET:  * No tourniquets in log *  DICTATION: .Other Dictation: Dictation Number [id number]  PLAN OF CARE: Admit for overnight observation  PATIENT DISPOSITION:  PACU - hemodynamically stable.   Delay start of Pharmacological VTE agent (>24hrs) due to surgical blood loss or risk of bleeding: yes

## 2012-02-05 NOTE — Anesthesia Preprocedure Evaluation (Signed)
Anesthesia Evaluation  Patient identified by MRN, date of birth, ID band Patient awake    Reviewed: Allergy & Precautions, H&P , NPO status , Patient's Chart, lab work & pertinent test results  Airway Mallampati: II TM Distance: >3 FB Neck ROM: Full    Dental No notable dental hx.    Pulmonary neg pulmonary ROS,  clear to auscultation  Pulmonary exam normal       Cardiovascular neg cardio ROS Regular Normal    Neuro/Psych Negative Neurological ROS  Negative Psych ROS   GI/Hepatic Neg liver ROS, hiatal hernia, GERD-  Medicated,  Endo/Other  Negative Endocrine ROS  Renal/GU negative Renal ROS  Genitourinary negative   Musculoskeletal negative musculoskeletal ROS (+)   Abdominal   Peds negative pediatric ROS (+)  Hematology negative hematology ROS (+)   Anesthesia Other Findings   Reproductive/Obstetrics negative OB ROS                           Anesthesia Physical Anesthesia Plan  ASA: II  Anesthesia Plan: General   Post-op Pain Management:    Induction: Intravenous  Airway Management Planned: Oral ETT  Additional Equipment:   Intra-op Plan:   Post-operative Plan: Extubation in OR  Informed Consent: I have reviewed the patients History and Physical, chart, labs and discussed the procedure including the risks, benefits and alternatives for the proposed anesthesia with the patient or authorized representative who has indicated his/her understanding and acceptance.   Dental advisory given  Plan Discussed with: CRNA  Anesthesia Plan Comments:         Anesthesia Quick Evaluation
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
## 2012-02-06 MED FILL — Thrombin For Soln Kit 5000 Unit: CUTANEOUS | Qty: 2 | Status: AC

## 2012-02-06 NOTE — Op Note (Signed)
NAMEENNIO, HOUP NO.:  [id number]  MEDICAL RECORD NO.:  [id number]  LOCATION:  1610                         FACILITY:  [facility]  PHYSICIAN:  Jene Every, M.D.    DATE OF BIRTH:  06-23-1951  DATE OF PROCEDURE:  02/05/2012 DATE OF DISCHARGE:  02/05/2012                              OPERATIVE REPORT   PREOPERATIVE DIAGNOSES:  Spinal stenosis and herniated nucleus pulposus, 4-5.  POSTOPERATIVE DIAGNOSES:  Spinal stenosis and herniated nucleus pulposus, 4-5.  PROCEDURE PERFORMED: 1. Lumbar decompression at L4-5 left with foraminotomies of L4 and L5,     left. 2. Microdiskectomy, L4-5, left.  ANESTHESIA:  General.  ASSISTANT:  Roma Schanz, P.A.  SPECIMEN:  L4-5 disk to Pathology.  HISTORY:  A 61 year old with refractory L5 radiculopathy, myotomal weakness, dermatomal dysesthesia secondary to multifactorial stenosis and small disk protrusion, positive neural tension signs, EHL weakness indicated for decompression.  Risks and benefits discussed including bleeding, infection, damage to neurovascular structures, CSF leakage, epidural fibrosis, adjacent segment disease, need for fusion in the future, DVT, PE, anesthetic complications, etc.  TECHNIQUE:  With the patient in supine position, after induction of adequate anesthesia, 2 g Kefzol; was placed prone on the Baxley frame. All bony prominences were well padded.  The lumbar region was prepped and draped in the usual sterile fashion.  Two 18-gauge spinal needles utilized to localize the 4-5 interspace, confirmed with x-ray.  An incision was made from the spinous process at L4 and L5.  Subcutaneous tissues were dissected.  Electrocautery was utilized to achieve hemostasis.  Dorsolumbar fascia identified by the line of skin incision. Paraspinous muscle elevated from lamina of L4 and L5.  McCullough retractors were placed.  Operating microscope draped on the surgical field.  Penfield 4 confirmed the  space at 4-5.  Ligamentum flavum detached from the cephalad edge of 5 utilizing straight curette. Hemilaminotomy caudad edge of 4 was performed.  Detached ligamentum flavum.  Ligamentum flavum removed from the interspace after neuro pattie placed beneath the ligamentum flavum.  Severe lateral recess stenosis was noted multifactorial secondary to  facet hypertrophy, ligamentum flavum hypertrophy, and an extruded fragment, which was identified.  We performed a foraminotomy at L5, identified the 5 root, gently mobilized it medially with gentle intermittent neural traction. No focal HNP was noted extruded and after decompressing the lateral recess to the medial border of the pedicle, I performed an annulotomy after retrieving the extruded fragment.  Copious portion of disk material was removed from the disk space with a straight pituitary from both sides of the operating room table.  Confirmatory radiograph obtained with a Penfield 4 in the disk space.  Next, copiously irrigated the disk space with antibiotic irrigations.  We had performed foraminotomy of L4 as well.  A 1 cm insertion of the 5 root medial pedicle without tension.  The nerve root was erythematous and edematous. No evidence of CSF leakage or active bleeding.  Therefore, draped epidural fat over the nerve root.  Removed the [facility] retractor, irrigated and inspected, no evidence of active bleeding.  Dorsolumbar fascia reapproximated with 0 Vicryl in a figure-of-eight suture, subcu with 2-0 Vicryl simple sutures.  Skin was reapproximated with  4-0 subcuticular Prolene.  Wound reinforced with Steri-Strips.  Sterile dressing applied, placed supine on the hospital bed, extubated without difficulty, transported to recovery room in satisfactory condition.  The patient tolerated the procedure well.  No complications.  Assistant was Roma Schanz, helped with gentle intermittent nerve retraction and sectioned disk to L4-5 to  Pathology.     Jene Every, M.D.     Cordelia Pen  D:  02/05/2012  T:  02/06/2012  Job:  [id number]

## 2012-02-10 NOTE — Discharge Summary (Signed)
reviewed
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
## 2012-02-12 IMAGING — CT CT L SPINE W/ CM
2 of 10 series · 5 of 20 positions shown, 6 images · IV contrast (omnipaque)
Comparison: MRI lumbar spine 01/10/2012.

MYELOGRAM INJECTION
TECHNIQUE: Informed consent was obtained from the patient prior to
the procedure, including potential complications of headache,
allergy, infection and pain. Specific instructions were given
regarding 24 hour bedrest post procedure to prevent post-LP
headache.  A timeout procedure was performed.  With the patient
prone, the lower back was prepped with Betadine.  1% Lidocaine was
used for local anesthesia.  Lumbar puncture was performed by the
radiologist at the L3-L4 level using a 22 gauge needle with return
of clear CSF.  15 cc of Omnipaque 180 was injected into the
subarachnoid space .
CLINICAL DATA: Low back and left leg pain.
TECHNIQUE: Multidetector CT imaging of the lumbar spine was
performed following myelography.  Multiplanar CT image
reconstructions were also generated.

[Series 2: l spine bone · axial · 0.27mm/px · z∈[-261,-176]mm · 2 of 104 slices shown, 3 images]
[im 35/104  soft-tissue]
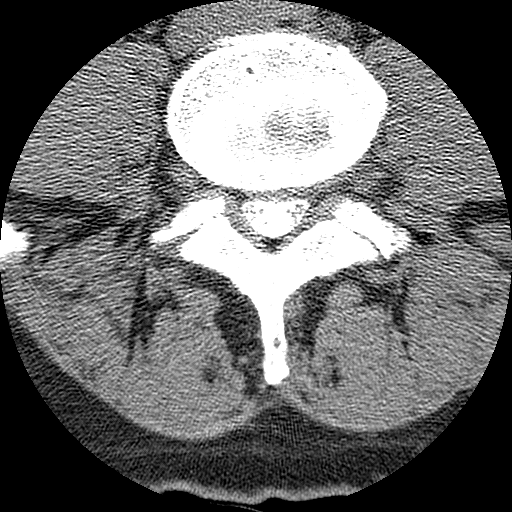
[im 35/104  bone]
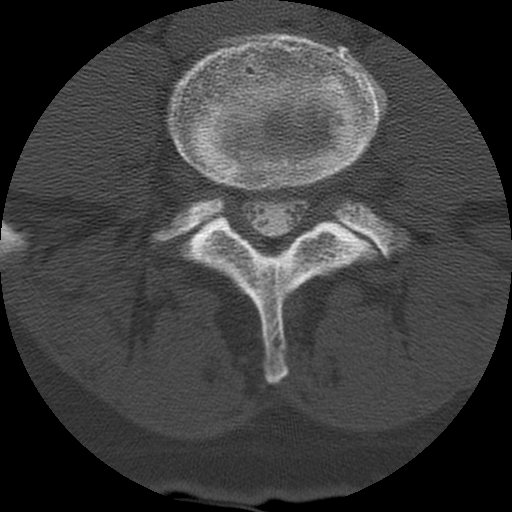
[im 69/104  bone]
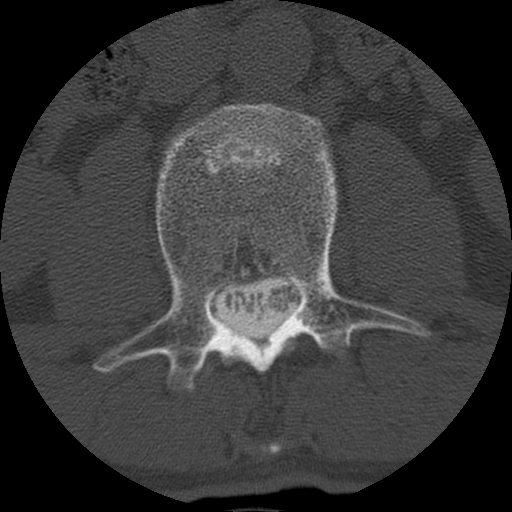

[Series 3: l spine soft · axial · 0.27mm/px · z∈[-284,-154]mm · 3 of 104 slices shown]
[im 26/104  soft-tissue]
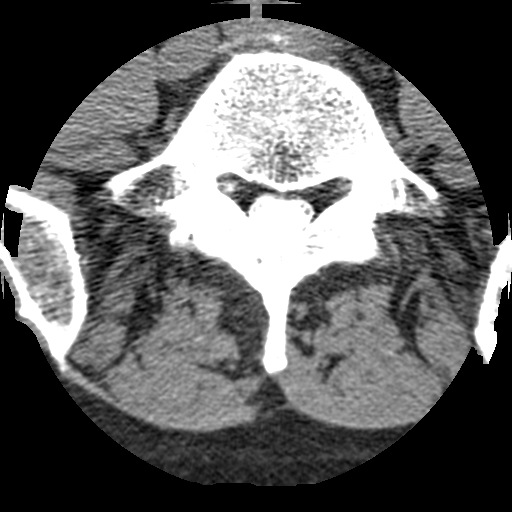
[im 52/104  soft-tissue]
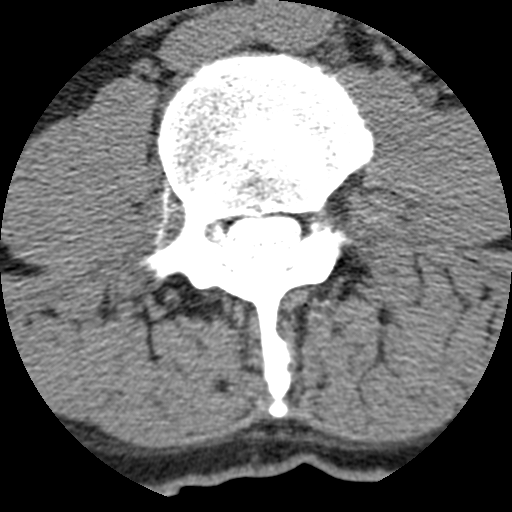
[im 78/104  soft-tissue]
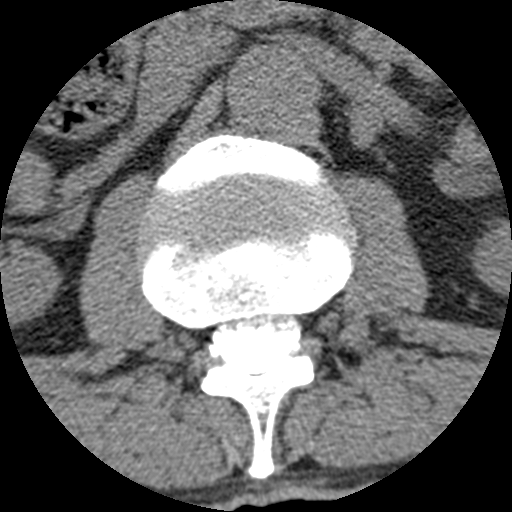

[5 of 20 positions shown; findings below may reference images not displayed]

IMPRESSION: Successful injection of  intrathecal contrast for myelography.

MYELOGRAM LUMBAR
FINDINGS: There is good opacification of the lumbar subarachnoid
space.  There is a ventral defect at L4-L5 effacing the thecal sac.
Left left greater than right L5 nerve root encroachment observed,
with frank nerve root cut off on the left.  There is advanced disc
space narrowing at L5-S1, but no S1 nerve root encroachment is
seen.

With the patient standing, there is anatomic alignment without
dynamic instability.

Fluoroscopy Time: 1.08 minutes
IMPRESSION: As above.

CT MYELOGRAPHY LUMBAR SPINE
FINDINGS: No prevertebral or paraspinous masses are observed.
Multiple Schmorl's nodes affecting T12, L1, and L2 cause slight
vertebral body endplate depression  and slight wedging at L1.
There is no evidence for acute fracture.  No retropulsion is seen.

L1-2: Mild bulge.  No stenosis or disc protrusion.

L2-3: Normal.

L3-4: Normal.

L4-5: Central protrusion with mild to moderate facet arthropathy.
There is  left greater than right L5 nerve root encroachment.  No
significant foraminal narrowing.

L5-S1: Postsurgical changes on the right.  Disc space narrowing
with central osteophyte formation slightly displaces the left
greater than right S1 nerve roots.  There is mild facet
arthropathy.  There is no frank S1 nerve root cutoff.  Asymmetric
foraminal narrowing on the right due to combination of bony
overgrowth, disc space narrowing, and disc material could affect
the right L5 nerve root.

Compared with prior MR, there is good general agreement.
IMPRESSION: The dominant left side abnormality is at L4-L5 where a central
protrusion associated with mild to moderate facet arthropathy
affects the left greater than right L5 nerve roots.  This is
apparent myelographically and at CT.

Postsurgical changes L5-S1, right and possibly left.  Slight
displacement left S1 nerve root without frank nerve root cut off.
Right-sided foraminal narrowing could affect the L5 nerve root.

## 2012-02-23 ENCOUNTER — Encounter (HOSPITAL_COMMUNITY): Payer: Self-pay | Admitting: Specialist

## 2018-05-24 DIAGNOSIS — Z0100 Encounter for examination of eyes and vision without abnormal findings: Secondary | ICD-10-CM | POA: Diagnosis not present

## 2018-05-24 DIAGNOSIS — H524 Presbyopia: Secondary | ICD-10-CM | POA: Diagnosis not present

## 2018-07-02 DIAGNOSIS — K317 Polyp of stomach and duodenum: Secondary | ICD-10-CM | POA: Diagnosis not present

## 2018-07-02 DIAGNOSIS — K2270 Barrett's esophagus without dysplasia: Secondary | ICD-10-CM | POA: Diagnosis not present

## 2018-07-06 DIAGNOSIS — K317 Polyp of stomach and duodenum: Secondary | ICD-10-CM | POA: Diagnosis not present

## 2018-07-06 DIAGNOSIS — K2270 Barrett's esophagus without dysplasia: Secondary | ICD-10-CM | POA: Diagnosis not present

## 2018-11-22 DIAGNOSIS — Z23 Encounter for immunization: Secondary | ICD-10-CM | POA: Diagnosis not present

## 2019-01-11 DIAGNOSIS — L821 Other seborrheic keratosis: Secondary | ICD-10-CM | POA: Diagnosis not present

## 2019-01-11 DIAGNOSIS — M5136 Other intervertebral disc degeneration, lumbar region: Secondary | ICD-10-CM | POA: Diagnosis not present

## 2019-01-11 DIAGNOSIS — Z79899 Other long term (current) drug therapy: Secondary | ICD-10-CM | POA: Diagnosis not present

## 2019-01-11 DIAGNOSIS — Z0000 Encounter for general adult medical examination without abnormal findings: Secondary | ICD-10-CM | POA: Diagnosis not present

## 2019-01-11 DIAGNOSIS — K219 Gastro-esophageal reflux disease without esophagitis: Secondary | ICD-10-CM | POA: Diagnosis not present

## 2019-01-11 DIAGNOSIS — R1031 Right lower quadrant pain: Secondary | ICD-10-CM | POA: Diagnosis not present

## 2019-01-11 DIAGNOSIS — Z23 Encounter for immunization: Secondary | ICD-10-CM | POA: Diagnosis not present

## 2019-01-11 DIAGNOSIS — Z125 Encounter for screening for malignant neoplasm of prostate: Secondary | ICD-10-CM | POA: Diagnosis not present

## 2019-01-11 DIAGNOSIS — K2270 Barrett's esophagus without dysplasia: Secondary | ICD-10-CM | POA: Diagnosis not present

## 2019-01-31 DIAGNOSIS — D225 Melanocytic nevi of trunk: Secondary | ICD-10-CM | POA: Diagnosis not present

## 2019-01-31 DIAGNOSIS — L814 Other melanin hyperpigmentation: Secondary | ICD-10-CM | POA: Diagnosis not present

## 2019-01-31 DIAGNOSIS — L570 Actinic keratosis: Secondary | ICD-10-CM | POA: Diagnosis not present

## 2019-01-31 DIAGNOSIS — L821 Other seborrheic keratosis: Secondary | ICD-10-CM | POA: Diagnosis not present

## 2019-01-31 DIAGNOSIS — D1801 Hemangioma of skin and subcutaneous tissue: Secondary | ICD-10-CM | POA: Diagnosis not present

## 2019-02-14 DIAGNOSIS — L570 Actinic keratosis: Secondary | ICD-10-CM | POA: Diagnosis not present
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
## 2019-02-17 ENCOUNTER — Ambulatory Visit (INDEPENDENT_AMBULATORY_CARE_PROVIDER_SITE_OTHER): Payer: Medicare HMO | Admitting: Cardiology

## 2019-02-17 ENCOUNTER — Encounter: Payer: Self-pay | Admitting: Cardiology

## 2019-02-17 VITALS — BP 136/94 | HR 66 | Ht 75.0 in | Wt 223.1 lb

## 2019-02-17 DIAGNOSIS — E7800 Pure hypercholesterolemia, unspecified: Secondary | ICD-10-CM | POA: Diagnosis not present

## 2019-02-17 DIAGNOSIS — R931 Abnormal findings on diagnostic imaging of heart and coronary circulation: Secondary | ICD-10-CM

## 2019-02-17 MED ORDER — PRAVASTATIN SODIUM 20 MG PO TABS: 20.0000 mg | ORAL_TABLET | Freq: Every evening | ORAL | 3 refills | Status: DC

## 2019-02-17 NOTE — Patient Instructions (Addendum)
Medication Instructions:  Your physician has recommended you make the following change in your medication:   START pravastatin 20 mg: Take 1 tablet daily in the evening  If you need a refill on your cardiac medications before your next appointment, please call your pharmacy.   Lab work: Your physician recommends that you return for lab work in 6 weeks: CMP, lipid panel. Please return to our office for lab work, no appointment needed. Please fast beforehand. Our office is open M-F 8-5, try to avoid 12-1 for lunch.   If you have labs (blood work) drawn today and your tests are completely normal, you will receive your results only by: Marland Kitchen MyChart Message (if you have MyChart) OR . A paper copy in the mail If you have any lab test that is abnormal or we need to change your treatment, we will call you to review the results.  Testing/Procedures: You had an EKG today.   Follow-Up: At [facility], you and your health needs are our priority.  As part of our continuing mission to provide you with exceptional heart care, we have created designated Provider Care Teams.  These Care Teams include your primary Cardiologist (physician) and Advanced Practice Providers (APPs -  Physician Assistants and Nurse Practitioners) who all work together to provide you with the care you need, when you need it. You will need a follow up appointment in 6 months.        Overview .  Heart scan (coronary calcium scan) A heart scan, also known as a coronary calcium scan, is a specialized X-ray test that provides pictures of your heart that can enable your doctor to detect and measure calcium-containing plaque in the arteries. Plaque inside the arteries of your heart can eventually grow and restrict or block the flow of blood to the muscles of the heart. The measurement of calcified plaque with a heart scan may enable your doctor to identify possible coronary artery disease before you have signs and symptoms. The  outcome of the test may indicate the need for medication or lifestyle changes to reduce the risk of heart attack or other heart problems. Your doctor may order a heart scan if he or she would like to get a better understanding of your risk of heart disease or if there is uncertainty about a treatment plan. Why it's done Coronary artery disease results in damage to the arteries primarily due to plaque and inflammation. Plaque deposits in and on the walls of coronary arteries can restrict the flow of oxygen-rich blood to the muscles of the heart. Plaque also may burst, triggering a blood clot that can cause a heart attack. Plaque is composed of fats, cholesterol, calcium and other substances in the blood. Plaque deposits develop gradually over time, long before there are any signs or symptoms of disease. The imaging test provides an early look at calcium levels. If there is calcium, then there is already some stiffening and narrowing of the artery (atherosclerosis). A heart scan uses a specialized X-ray technology called multidetector row or multislice computerized tomography (CT), which creates multiple images of the calcium deposits. The amount of detected calcium provides a measure of how much plaque has accumulated, and the data from the scan are used to calculate a score. When combined with other health information, your doctor may use the test score to refine a treatment plan for reducing your risk. When is a heart scan used? A heart scan is generally considered useful for people who have a  known moderate risk of heart disease or when the risk is uncertain. There are different tools for an initial risk assessment, but all of them use factors, such as your age, sex, blood pressure, cholesterol levels and tobacco use. A moderate risk is generally defined as a 5 to 7.5 percent chance of a heart attack in the next 10 years or when the risk is calculated to be low, but there is a history of heart attacks at an  early age in the family. The [employer] and the American Heart Association guidelines published in 2013 on cardiovascular risk assessment suggested that it would be reasonable to perform coronary calcium heart scans for people with a calculated risk of 5 to 7.5 percent or when "a risk-based treatment decision is uncertain." Some studies have demonstrated that a heart scan may be a motivational factor for people at moderate risk to make lifestyle changes and follow treatment plans.    Pravastatin tablets What is this medicine? PRAVASTATIN (PRA va stat in) is known as a HMG-CoA reductase inhibitor or 'statin'. It lowers the level of cholesterol and triglycerides in the blood. This drug may also reduce the risk of heart attack, stroke, or other health problems in patients with risk factors for heart disease. Diet and lifestyle changes are often used with this drug. This medicine may be used for other purposes; ask your health care provider or pharmacist if you have questions. COMMON BRAND NAME(S): Pravachol What should I tell my health care provider before I take this medicine? They need to know if you have any of these conditions: -diabetes -if you often drink alcohol -history of stroke -kidney disease -liver disease -muscle aches or weakness -thyroid disease -an unusual or allergic reaction to pravastatin, other medicines, foods, dyes, or preservatives -pregnant or trying to get pregnant -breast-feeding How should I use this medicine? Take pravastatin tablets by mouth. Swallow the tablets with a drink of water. Pravastatin can be taken at anytime of the day, with or without food. Follow the directions on the prescription label. Take your doses at regular intervals. Do not take your medicine more often than directed. Talk to your pediatrician regarding the use of this medicine in children. Special care may be needed. Pravastatin has been used in children as young as 50 years  of age. Overdosage: If you think you have taken too much of this medicine contact a poison control center or emergency room at once. NOTE: This medicine is only for you. Do not share this medicine with others. What if I miss a dose? If you miss a dose, take it as soon as you can. If it is almost time for your next dose, take only that dose. Do not take double or extra doses. What may interact with this medicine? This medicine may interact with the following medications: -colchicine -cyclosporine -other medicines for high cholesterol -some antibiotics like azithromycin, clarithromycin, erythromycin, and telithromycin This list may not describe all possible interactions. Give your health care provider a list of all the medicines, herbs, non-prescription drugs, or dietary supplements you use. Also tell them if you smoke, drink alcohol, or use illegal drugs. Some items may interact with your medicine. What should I watch for while using this medicine? Visit your doctor or health care professional for regular check-ups. You may need regular tests to make sure your liver is working properly. Your health care professional may tell you to stop taking this medicine if you develop muscle problems. If your muscle  problems do not go away after stopping this medicine, contact your health care professional. Do not become pregnant while taking this medicine. Women should inform their health care professional if they wish to become pregnant or think they might be pregnant. There is a potential for serious side effects to an unborn child. Talk to your health care professional or pharmacist for more information. Do not breast-feed an infant while taking this medicine. This medicine may affect blood sugar levels. If you have diabetes, check with your doctor or health care professional before you change your diet or the dose of your diabetic medicine. If you are going to need surgery or other procedure, tell your  doctor that you are using this medicine. This drug is only part of a total heart-health program. Your doctor or a dietician can suggest a low-cholesterol and low-fat diet to help. Avoid alcohol and smoking, and keep a proper exercise schedule. This medicine may cause a decrease in Co-Enzyme Q-10. You should make sure that you get enough Co-Enzyme Q-10 while you are taking this medicine. Discuss the foods you eat and the vitamins you take with your health care professional. What side effects may I notice from receiving this medicine? Side effects that you should report to your doctor or health care professional as soon as possible: -allergic reactions like skin rash, itching or hives, swelling of the face, lips, or tongue -dark urine -fever -muscle pain, cramps, or weakness -redness, blistering, peeling or loosening of the skin, including inside the mouth -trouble passing urine or change in the amount of urine -unusually weak or tired -yellowing of the eyes or skin Side effects that usually do not require medical attention (report to your doctor or health care professional if they continue or are bothersome): -gas -headache -heartburn -indigestion -stomach pain This list may not describe all possible side effects. Call your doctor for medical advice about side effects. You may report side effects to FDA at 1-800-FDA-1088. Where should I keep my medicine? Keep out of the reach of children. Store at room temperature between 15 to 30 degrees C (59 to 86 degrees F). Protect from light. Keep container tightly closed. Throw away any unused medicine after the expiration date. NOTE: This sheet is a summary. It may not cover all possible information. If you have questions about this medicine, talk to your doctor, pharmacist, or health care provider.  2019 Elsevier/Gold Standard (2017-08-11 12:37:09)

## 2019-03-23 ENCOUNTER — Telehealth: Payer: Self-pay | Admitting: Cardiology

## 2019-03-23 NOTE — Telephone Encounter (Signed)
Patient informed and verbalized understanding. No further questions.

## 2019-03-23 NOTE — Telephone Encounter (Signed)
Patient states he is supposed to come in for lab work this week.  He wants to know if he must come in for labs or can he wait 6 weeks or so?  Please call patient to discuss

## 2019-03-23 NOTE — Telephone Encounter (Signed)
I feel he can safely defer labs at least 2 months until our isolation in place is been removed pravastatin is a very safe medication and I would tell him not to worry about the pending lab tests

## 2019-03-23 NOTE — Telephone Encounter (Signed)
Patient was seen on 02/17/2019 and was started on pravastatin 20 mg daily. Patient is due to have a CMP and lipid panel drawn now. Please advise on urgency of ordered labs. Thanks!

## 2019-06-07 ENCOUNTER — Telehealth: Payer: Self-pay | Admitting: Cardiology

## 2019-06-07 MED ORDER — PRAVASTATIN SODIUM 20 MG PO TABS: 20.0000 mg | ORAL_TABLET | Freq: Every evening | ORAL | 2 refills | Status: DC

## 2019-06-07 NOTE — Addendum Note (Signed)
Addended by: Polly Cobia A on: 06/07/2019 09:08 AM   Modules accepted: Orders

## 2019-06-07 NOTE — Telephone Encounter (Signed)
Pravastatin refill sent to [facility] in [facility] per preferentc

## 2019-06-07 NOTE — Telephone Encounter (Signed)
Call [facility]astatin #90 to [facility] in [facility]

## 2019-08-08 ENCOUNTER — Telehealth: Payer: Self-pay | Admitting: Cardiology

## 2019-08-08 NOTE — Telephone Encounter (Signed)
Patient advised to keep his follow up appointment as scheduled. Patient sees Dr. Bettina Gavia in the [facility] office and due to schedule changes his appointment was for the [facility] office. Cancelled this appointment and rescheduled his 6 month follow up appointment for Thursday, 09/01/2019, at 11:20 am in the [facility] office. Patient is agreeable and verbalized understanding. No further questions.

## 2019-08-08 NOTE — Telephone Encounter (Signed)
Patient called wanting to know if it is necessary he come to his F/U appt

## 2019-08-11 ENCOUNTER — Ambulatory Visit: Payer: Medicare HMO | Admitting: Cardiology

## 2019-08-31 ENCOUNTER — Other Ambulatory Visit: Payer: Self-pay | Admitting: Cardiology
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
## 2019-09-01 ENCOUNTER — Ambulatory Visit (INDEPENDENT_AMBULATORY_CARE_PROVIDER_SITE_OTHER): Payer: Medicare HMO | Admitting: Cardiology

## 2019-09-01 ENCOUNTER — Other Ambulatory Visit: Payer: Self-pay

## 2019-09-01 ENCOUNTER — Encounter: Payer: Self-pay | Admitting: Cardiology

## 2019-09-01 VITALS — BP 122/80 | HR 68 | Temp 97.3°F | Ht 75.0 in | Wt 220.0 lb

## 2019-09-01 DIAGNOSIS — Z79899 Other long term (current) drug therapy: Secondary | ICD-10-CM

## 2019-09-01 DIAGNOSIS — I2584 Coronary atherosclerosis due to calcified coronary lesion: Secondary | ICD-10-CM

## 2019-09-01 DIAGNOSIS — E7800 Pure hypercholesterolemia, unspecified: Secondary | ICD-10-CM

## 2019-09-01 DIAGNOSIS — I2510 Atherosclerotic heart disease of native coronary artery without angina pectoris: Secondary | ICD-10-CM | POA: Diagnosis not present

## 2019-09-01 NOTE — Patient Instructions (Signed)
Medication Instructions:  No medication changes today.   If you need a refill on your cardiac medications before your next appointment, please call your pharmacy.   Lab work: Your physician recommends that you return for lab work today: CMP, lipid profile, lipoprotein a  This will check your kidneys, liver, electrolytes, and cholesterol. Lipoprotein a is a test to check for familial hyperlipidemia.   If you have labs (blood work) drawn today and your tests are completely normal, you will receive your results only by: Marland Kitchen MyChart Message (if you have MyChart) OR . A paper copy in the mail If you have any lab test that is abnormal or we need to change your treatment, we will call you to review the results.  Testing/Procedures: None ordered today.   Follow-Up: At [facility], you and your health needs are our priority.  As part of our continuing mission to provide you with exceptional heart care, we have created designated Provider Care Teams.  These Care Teams include your primary Cardiologist (physician) and Advanced Practice Providers (APPs -  Physician Assistants and Nurse Practitioners) who all work together to provide you with the care you need, when you need it. You may follow up on an as-needed basis.   You may see Dr. Bettina Gavia or another member of our [facility] Provider Team in [place]: Jenne Campus, MD . Jyl Heinz, MD . Berniece Salines, MD . Laurann Montana, NP  Any Other Special Instructions Will Be Listed Below (If Applicable).

## 2019-09-02 ENCOUNTER — Telehealth: Payer: Self-pay | Admitting: Family

## 2019-09-02 DIAGNOSIS — E782 Mixed hyperlipidemia: Secondary | ICD-10-CM

## 2019-09-02 LAB — COMPREHENSIVE METABOLIC PANEL
ALT: 22 IU/L (ref 0–44)
AST: 23 IU/L (ref 0–40)
Albumin/Globulin Ratio: 2.3 — ABNORMAL HIGH (ref 1.2–2.2)
Albumin: 4.4 g/dL (ref 3.8–4.8)
Alkaline Phosphatase: 72 IU/L (ref 39–117)
BUN/Creatinine Ratio: 18 (ref 10–24)
BUN: 18 mg/dL (ref 8–27)
Bilirubin Total: 0.7 mg/dL (ref 0.0–1.2)
CO2: 24 mmol/L (ref 20–29)
Calcium: 9.1 mg/dL (ref 8.6–10.2)
Chloride: 103 mmol/L (ref 96–106)
Creatinine, Ser: 0.99 mg/dL (ref 0.76–1.27)
GFR calc Af Amer: 91 mL/min/{1.73_m2} (ref >59)
GFR calc non Af Amer: 79 mL/min/{1.73_m2} (ref >59)
Globulin, Total: 1.9 g/dL (ref 1.5–4.5)
Glucose: 99 mg/dL (ref 65–99)
Potassium: 4.4 mmol/L (ref 3.5–5.2)
Sodium: 139 mmol/L (ref 134–144)
Total Protein: 6.3 g/dL (ref 6.0–8.5)

## 2019-09-02 LAB — LIPID PANEL
Chol/HDL Ratio: 2.4 ratio (ref 0.0–5.0)
Cholesterol, Total: 207 mg/dL — ABNORMAL HIGH (ref 100–199)
HDL: 85 mg/dL (ref >39)
LDL Chol Calc (NIH): 107 mg/dL — ABNORMAL HIGH (ref 0–99)
Triglycerides: 83 mg/dL (ref 0–149)
VLDL Cholesterol Cal: 15 mg/dL (ref 5–40)

## 2019-09-02 LAB — LIPOPROTEIN A (LPA): Lipoprotein (a): 21.6 nmol/L (ref <75.0)

## 2019-09-02 MED ORDER — PRAVASTATIN SODIUM 20 MG PO TABS: 20.0000 mg | ORAL_TABLET | Freq: Every day | ORAL | 1 refills | Status: DC

## 2019-09-02 MED ORDER — PRAVASTATIN SODIUM 20 MG PO TABS: 20.0000 mg | ORAL_TABLET | Freq: Every day | ORAL | 1 refills | Status: AC

## 2019-09-02 NOTE — Telephone Encounter (Addendum)
Called and left message. Mr. Kissam returned my call - verbalized understanding of results and all questions answered.   Lipid profile results good. LDL went from 157 to 107 on Pravastatin. Lipoprotein a normal - indicating no familial hyperlipidemia. Normal kidney function, liver function, electrolytes.   Sent 6 month supply of Pravastatin to [facility] in [place]. Further refills can be done by PCP as he is following only on an as-needed basis. Encouraged to continue his regular exercise and heart healthy diet.   Will forward lab results to his PCP at his request.   Loel Dubonnet, NP

## 2019-09-02 NOTE — Addendum Note (Signed)
Addended by: Loel Dubonnet on: 09/02/2019 04:44 PM   Modules accepted: Orders

## 2019-09-29 DIAGNOSIS — L570 Actinic keratosis: Secondary | ICD-10-CM | POA: Diagnosis not present

## 2019-11-02 DIAGNOSIS — L570 Actinic keratosis: Secondary | ICD-10-CM | POA: Diagnosis not present

## 2019-12-08 DIAGNOSIS — L570 Actinic keratosis: Secondary | ICD-10-CM | POA: Diagnosis not present

## 2019-12-08 DIAGNOSIS — R208 Other disturbances of skin sensation: Secondary | ICD-10-CM | POA: Diagnosis not present

## 2019-12-08 DIAGNOSIS — L820 Inflamed seborrheic keratosis: Secondary | ICD-10-CM | POA: Diagnosis not present

## 2020-01-17 ENCOUNTER — Ambulatory Visit: Payer: Medicare HMO

## 2020-01-26 ENCOUNTER — Ambulatory Visit: Payer: Medicare HMO

## 2020-01-31 DIAGNOSIS — L821 Other seborrheic keratosis: Secondary | ICD-10-CM | POA: Diagnosis not present

## 2020-01-31 DIAGNOSIS — D1801 Hemangioma of skin and subcutaneous tissue: Secondary | ICD-10-CM | POA: Diagnosis not present

## 2020-01-31 DIAGNOSIS — L309 Dermatitis, unspecified: Secondary | ICD-10-CM | POA: Diagnosis not present

## 2020-01-31 DIAGNOSIS — D225 Melanocytic nevi of trunk: Secondary | ICD-10-CM | POA: Diagnosis not present

## 2020-01-31 DIAGNOSIS — L570 Actinic keratosis: Secondary | ICD-10-CM | POA: Diagnosis not present

## 2020-01-31 DIAGNOSIS — L814 Other melanin hyperpigmentation: Secondary | ICD-10-CM | POA: Diagnosis not present

## 2020-01-31 DIAGNOSIS — L820 Inflamed seborrheic keratosis: Secondary | ICD-10-CM | POA: Diagnosis not present

## 2020-01-31 DIAGNOSIS — I788 Other diseases of capillaries: Secondary | ICD-10-CM | POA: Diagnosis not present

## 2020-01-31 DIAGNOSIS — R208 Other disturbances of skin sensation: Secondary | ICD-10-CM | POA: Diagnosis not present

## 2020-02-07 ENCOUNTER — Ambulatory Visit: Payer: Medicare HMO

## 2020-03-08 DIAGNOSIS — Z0100 Encounter for examination of eyes and vision without abnormal findings: Secondary | ICD-10-CM | POA: Diagnosis not present

## 2020-03-08 DIAGNOSIS — H524 Presbyopia: Secondary | ICD-10-CM | POA: Diagnosis not present

## 2020-05-04 DIAGNOSIS — K2270 Barrett's esophagus without dysplasia: Secondary | ICD-10-CM | POA: Diagnosis not present

## 2020-05-04 DIAGNOSIS — Z0001 Encounter for general adult medical examination with abnormal findings: Secondary | ICD-10-CM | POA: Diagnosis not present

## 2020-05-04 DIAGNOSIS — Z79899 Other long term (current) drug therapy: Secondary | ICD-10-CM | POA: Diagnosis not present

## 2020-05-04 DIAGNOSIS — N401 Enlarged prostate with lower urinary tract symptoms: Secondary | ICD-10-CM | POA: Diagnosis not present

## 2020-05-04 DIAGNOSIS — Z125 Encounter for screening for malignant neoplasm of prostate: Secondary | ICD-10-CM | POA: Diagnosis not present

## 2020-05-04 DIAGNOSIS — E7800 Pure hypercholesterolemia, unspecified: Secondary | ICD-10-CM | POA: Diagnosis not present

## 2020-05-04 DIAGNOSIS — Z23 Encounter for immunization: Secondary | ICD-10-CM | POA: Diagnosis not present

## 2020-05-04 DIAGNOSIS — N138 Other obstructive and reflux uropathy: Secondary | ICD-10-CM | POA: Diagnosis not present

## 2020-05-04 DIAGNOSIS — M5136 Other intervertebral disc degeneration, lumbar region: Secondary | ICD-10-CM | POA: Diagnosis not present

## 2020-07-05 DIAGNOSIS — L570 Actinic keratosis: Secondary | ICD-10-CM | POA: Diagnosis not present

## 2020-07-05 DIAGNOSIS — L910 Hypertrophic scar: Secondary | ICD-10-CM | POA: Diagnosis not present

## 2020-07-05 DIAGNOSIS — L708 Other acne: Secondary | ICD-10-CM | POA: Diagnosis not present

## 2020-09-02 ENCOUNTER — Other Ambulatory Visit: Payer: Self-pay | Admitting: Family

## 2020-09-02 DIAGNOSIS — E782 Mixed hyperlipidemia: Secondary | ICD-10-CM

## 2020-11-30 ENCOUNTER — Encounter (INDEPENDENT_AMBULATORY_CARE_PROVIDER_SITE_OTHER): Payer: Self-pay | Admitting: Ophthalmology

## 2020-11-30 ENCOUNTER — Other Ambulatory Visit: Payer: Self-pay

## 2020-11-30 ENCOUNTER — Ambulatory Visit (INDEPENDENT_AMBULATORY_CARE_PROVIDER_SITE_OTHER): Payer: Medicare HMO | Admitting: Ophthalmology

## 2020-11-30 DIAGNOSIS — H35033 Hypertensive retinopathy, bilateral: Secondary | ICD-10-CM | POA: Diagnosis not present

## 2020-11-30 DIAGNOSIS — H25813 Combined forms of age-related cataract, bilateral: Secondary | ICD-10-CM | POA: Diagnosis not present

## 2020-11-30 DIAGNOSIS — I10 Essential (primary) hypertension: Secondary | ICD-10-CM | POA: Diagnosis not present

## 2020-11-30 DIAGNOSIS — H43811 Vitreous degeneration, right eye: Secondary | ICD-10-CM | POA: Diagnosis not present

## 2020-11-30 DIAGNOSIS — H3581 Retinal edema: Secondary | ICD-10-CM
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
## 2020-12-28 ENCOUNTER — Other Ambulatory Visit: Payer: Self-pay

## 2020-12-28 ENCOUNTER — Ambulatory Visit (INDEPENDENT_AMBULATORY_CARE_PROVIDER_SITE_OTHER): Payer: Medicare HMO | Admitting: Ophthalmology

## 2020-12-28 DIAGNOSIS — H25813 Combined forms of age-related cataract, bilateral: Secondary | ICD-10-CM | POA: Diagnosis not present

## 2020-12-28 DIAGNOSIS — H35033 Hypertensive retinopathy, bilateral: Secondary | ICD-10-CM | POA: Diagnosis not present

## 2020-12-28 DIAGNOSIS — I10 Essential (primary) hypertension: Secondary | ICD-10-CM

## 2020-12-28 DIAGNOSIS — H3581 Retinal edema: Secondary | ICD-10-CM

## 2020-12-28 DIAGNOSIS — H43811 Vitreous degeneration, right eye: Secondary | ICD-10-CM | POA: Diagnosis not present

## 2020-12-28 DIAGNOSIS — H04123 Dry eye syndrome of bilateral lacrimal glands: Secondary | ICD-10-CM

## 2021-03-01 DIAGNOSIS — M50321 Other cervical disc degeneration at C4-C5 level: Secondary | ICD-10-CM | POA: Diagnosis not present

## 2021-03-01 DIAGNOSIS — R52 Pain, unspecified: Secondary | ICD-10-CM | POA: Diagnosis not present

## 2021-03-01 DIAGNOSIS — M2578 Osteophyte, vertebrae: Secondary | ICD-10-CM | POA: Diagnosis not present

## 2021-03-01 DIAGNOSIS — M5030 Other cervical disc degeneration, unspecified cervical region: Secondary | ICD-10-CM | POA: Diagnosis not present

## 2021-03-01 DIAGNOSIS — M5412 Radiculopathy, cervical region: Secondary | ICD-10-CM | POA: Diagnosis not present

## 2021-03-20 DIAGNOSIS — M5412 Radiculopathy, cervical region: Secondary | ICD-10-CM | POA: Diagnosis not present

## 2021-03-20 DIAGNOSIS — M79602 Pain in left arm: Secondary | ICD-10-CM | POA: Diagnosis not present

## 2021-03-20 DIAGNOSIS — M5382 Other specified dorsopathies, cervical region: Secondary | ICD-10-CM | POA: Diagnosis not present

## 2021-03-20 DIAGNOSIS — M542 Cervicalgia: Secondary | ICD-10-CM | POA: Diagnosis not present

## 2021-03-20 DIAGNOSIS — M6281 Muscle weakness (generalized): Secondary | ICD-10-CM | POA: Diagnosis not present

## 2021-03-21 DIAGNOSIS — L820 Inflamed seborrheic keratosis: Secondary | ICD-10-CM | POA: Diagnosis not present

## 2021-03-21 DIAGNOSIS — B351 Tinea unguium: Secondary | ICD-10-CM | POA: Diagnosis not present

## 2021-03-21 DIAGNOSIS — L570 Actinic keratosis: Secondary | ICD-10-CM | POA: Diagnosis not present

## 2021-03-21 DIAGNOSIS — D225 Melanocytic nevi of trunk: Secondary | ICD-10-CM | POA: Diagnosis not present

## 2021-03-21 DIAGNOSIS — L720 Epidermal cyst: Secondary | ICD-10-CM | POA: Diagnosis not present

## 2021-03-21 DIAGNOSIS — L578 Other skin changes due to chronic exposure to nonionizing radiation: Secondary | ICD-10-CM | POA: Diagnosis not present

## 2021-03-21 DIAGNOSIS — L814 Other melanin hyperpigmentation: Secondary | ICD-10-CM | POA: Diagnosis not present

## 2021-03-21 DIAGNOSIS — L821 Other seborrheic keratosis: Secondary | ICD-10-CM | POA: Diagnosis not present

## 2021-03-22 DIAGNOSIS — M5412 Radiculopathy, cervical region: Secondary | ICD-10-CM | POA: Diagnosis not present

## 2021-03-27 DIAGNOSIS — M5412 Radiculopathy, cervical region: Secondary | ICD-10-CM | POA: Diagnosis not present

## 2021-03-28 DIAGNOSIS — M5412 Radiculopathy, cervical region: Secondary | ICD-10-CM | POA: Diagnosis not present

## 2021-03-29 DIAGNOSIS — M4802 Spinal stenosis, cervical region: Secondary | ICD-10-CM | POA: Diagnosis not present

## 2021-03-29 DIAGNOSIS — M4722 Other spondylosis with radiculopathy, cervical region: Secondary | ICD-10-CM | POA: Diagnosis not present

## 2021-03-29 DIAGNOSIS — M5412 Radiculopathy, cervical region: Secondary | ICD-10-CM | POA: Diagnosis not present

## 2021-03-29 DIAGNOSIS — M5011 Cervical disc disorder with radiculopathy,  high cervical region: Secondary | ICD-10-CM | POA: Diagnosis not present

## 2021-04-02 DIAGNOSIS — M5412 Radiculopathy, cervical region: Secondary | ICD-10-CM | POA: Diagnosis not present

## 2021-04-04 DIAGNOSIS — M5412 Radiculopathy, cervical region: Secondary | ICD-10-CM | POA: Diagnosis not present

## 2021-04-09 DIAGNOSIS — M5412 Radiculopathy, cervical region: Secondary | ICD-10-CM | POA: Diagnosis not present

## 2021-04-16 DIAGNOSIS — M5412 Radiculopathy, cervical region: Secondary | ICD-10-CM | POA: Diagnosis not present

## 2021-04-18 DIAGNOSIS — M5030 Other cervical disc degeneration, unspecified cervical region: Secondary | ICD-10-CM | POA: Diagnosis not present

## 2021-04-18 DIAGNOSIS — M4802 Spinal stenosis, cervical region: Secondary | ICD-10-CM | POA: Diagnosis not present

## 2021-04-18 DIAGNOSIS — M4722 Other spondylosis with radiculopathy, cervical region: Secondary | ICD-10-CM | POA: Diagnosis not present

## 2021-05-03 DIAGNOSIS — M5412 Radiculopathy, cervical region: Secondary | ICD-10-CM | POA: Diagnosis not present

## 2021-05-03 DIAGNOSIS — M47812 Spondylosis without myelopathy or radiculopathy, cervical region: Secondary | ICD-10-CM | POA: Diagnosis not present

## 2021-05-03 DIAGNOSIS — M5030 Other cervical disc degeneration, unspecified cervical region: Secondary | ICD-10-CM | POA: Diagnosis not present

## 2021-05-03 DIAGNOSIS — M4802 Spinal stenosis, cervical region: Secondary | ICD-10-CM | POA: Diagnosis not present

## 2021-05-15 DIAGNOSIS — M5030 Other cervical disc degeneration, unspecified cervical region: Secondary | ICD-10-CM | POA: Diagnosis not present

## 2021-05-15 DIAGNOSIS — M4722 Other spondylosis with radiculopathy, cervical region: Secondary | ICD-10-CM | POA: Diagnosis not present

## 2021-05-15 DIAGNOSIS — Z01810 Encounter for preprocedural cardiovascular examination: Secondary | ICD-10-CM | POA: Diagnosis not present

## 2021-05-15 DIAGNOSIS — M4802 Spinal stenosis, cervical region: Secondary | ICD-10-CM | POA: Diagnosis not present

## 2021-05-15 DIAGNOSIS — Z01812 Encounter for preprocedural laboratory examination: Secondary | ICD-10-CM | POA: Diagnosis not present

## 2021-05-15 DIAGNOSIS — Z01818 Encounter for other preprocedural examination: Secondary | ICD-10-CM | POA: Diagnosis not present

## 2021-05-22 DIAGNOSIS — M5412 Radiculopathy, cervical region: Secondary | ICD-10-CM | POA: Diagnosis not present

## 2021-05-22 DIAGNOSIS — M2578 Osteophyte, vertebrae: Secondary | ICD-10-CM | POA: Diagnosis not present

## 2021-05-22 DIAGNOSIS — Z79899 Other long term (current) drug therapy: Secondary | ICD-10-CM | POA: Diagnosis not present

## 2021-05-22 DIAGNOSIS — K219 Gastro-esophageal reflux disease without esophagitis: Secondary | ICD-10-CM | POA: Diagnosis not present

## 2021-05-22 DIAGNOSIS — E785 Hyperlipidemia, unspecified: Secondary | ICD-10-CM | POA: Diagnosis not present

## 2021-05-22 DIAGNOSIS — Z981 Arthrodesis status: Secondary | ICD-10-CM | POA: Diagnosis not present

## 2021-05-22 DIAGNOSIS — M1990 Unspecified osteoarthritis, unspecified site: Secondary | ICD-10-CM | POA: Diagnosis not present

## 2021-05-22 DIAGNOSIS — M4802 Spinal stenosis, cervical region: Secondary | ICD-10-CM | POA: Diagnosis not present

## 2021-05-29 DIAGNOSIS — M25612 Stiffness of left shoulder, not elsewhere classified: Secondary | ICD-10-CM | POA: Diagnosis not present

## 2021-05-29 DIAGNOSIS — M6289 Other specified disorders of muscle: Secondary | ICD-10-CM | POA: Diagnosis not present

## 2021-05-29 DIAGNOSIS — M5412 Radiculopathy, cervical region: Secondary | ICD-10-CM | POA: Diagnosis not present

## 2021-05-29 DIAGNOSIS — M6281 Muscle weakness (generalized): Secondary | ICD-10-CM | POA: Diagnosis not present

## 2021-05-30 DIAGNOSIS — M6281 Muscle weakness (generalized): Secondary | ICD-10-CM | POA: Diagnosis not present

## 2021-05-30 DIAGNOSIS — M25612 Stiffness of left shoulder, not elsewhere classified: Secondary | ICD-10-CM | POA: Diagnosis not present

## 2021-05-30 DIAGNOSIS — M5412 Radiculopathy, cervical region: Secondary | ICD-10-CM | POA: Diagnosis not present

## 2021-05-30 DIAGNOSIS — M6289 Other specified disorders of muscle: Secondary | ICD-10-CM | POA: Diagnosis not present

## 2021-05-31 DIAGNOSIS — M6281 Muscle weakness (generalized): Secondary | ICD-10-CM | POA: Diagnosis not present

## 2021-05-31 DIAGNOSIS — M25612 Stiffness of left shoulder, not elsewhere classified: Secondary | ICD-10-CM | POA: Diagnosis not present

## 2021-05-31 DIAGNOSIS — M5412 Radiculopathy, cervical region: Secondary | ICD-10-CM | POA: Diagnosis not present

## 2021-05-31 DIAGNOSIS — M6289 Other specified disorders of muscle: Secondary | ICD-10-CM | POA: Diagnosis not present

## 2021-06-03 DIAGNOSIS — M25612 Stiffness of left shoulder, not elsewhere classified: Secondary | ICD-10-CM | POA: Diagnosis not present

## 2021-06-03 DIAGNOSIS — M5412 Radiculopathy, cervical region: Secondary | ICD-10-CM | POA: Diagnosis not present

## 2021-06-03 DIAGNOSIS — M6281 Muscle weakness (generalized): Secondary | ICD-10-CM | POA: Diagnosis not present

## 2021-06-03 DIAGNOSIS — M6289 Other specified disorders of muscle: Secondary | ICD-10-CM | POA: Diagnosis not present

## 2021-06-05 DIAGNOSIS — M5412 Radiculopathy, cervical region: Secondary | ICD-10-CM | POA: Diagnosis not present

## 2021-06-05 DIAGNOSIS — M6281 Muscle weakness (generalized): Secondary | ICD-10-CM | POA: Diagnosis not present

## 2021-06-05 DIAGNOSIS — M25612 Stiffness of left shoulder, not elsewhere classified: Secondary | ICD-10-CM | POA: Diagnosis not present

## 2021-06-05 DIAGNOSIS — M6289 Other specified disorders of muscle: Secondary | ICD-10-CM | POA: Diagnosis not present

## 2021-06-10 DIAGNOSIS — M6281 Muscle weakness (generalized): Secondary | ICD-10-CM | POA: Diagnosis not present

## 2021-06-10 DIAGNOSIS — M6289 Other specified disorders of muscle: Secondary | ICD-10-CM | POA: Diagnosis not present

## 2021-06-10 DIAGNOSIS — M25612 Stiffness of left shoulder, not elsewhere classified: Secondary | ICD-10-CM | POA: Diagnosis not present

## 2021-06-10 DIAGNOSIS — M5412 Radiculopathy, cervical region: Secondary | ICD-10-CM | POA: Diagnosis not present

## 2021-06-12 DIAGNOSIS — M6289 Other specified disorders of muscle: Secondary | ICD-10-CM | POA: Diagnosis not present

## 2021-06-12 DIAGNOSIS — M5412 Radiculopathy, cervical region: Secondary | ICD-10-CM | POA: Diagnosis not present

## 2021-06-12 DIAGNOSIS — M6281 Muscle weakness (generalized): Secondary | ICD-10-CM | POA: Diagnosis not present

## 2021-06-12 DIAGNOSIS — M25612 Stiffness of left shoulder, not elsewhere classified: Secondary | ICD-10-CM | POA: Diagnosis not present

## 2021-06-18 DIAGNOSIS — M25612 Stiffness of left shoulder, not elsewhere classified: Secondary | ICD-10-CM | POA: Diagnosis not present

## 2021-06-18 DIAGNOSIS — M6289 Other specified disorders of muscle: Secondary | ICD-10-CM | POA: Diagnosis not present

## 2021-06-18 DIAGNOSIS — M5412 Radiculopathy, cervical region: Secondary | ICD-10-CM | POA: Diagnosis not present

## 2021-06-18 DIAGNOSIS — M6281 Muscle weakness (generalized): Secondary | ICD-10-CM | POA: Diagnosis not present

## 2021-06-20 DIAGNOSIS — M6281 Muscle weakness (generalized): Secondary | ICD-10-CM | POA: Diagnosis not present

## 2021-06-20 DIAGNOSIS — M5412 Radiculopathy, cervical region: Secondary | ICD-10-CM | POA: Diagnosis not present

## 2021-06-20 DIAGNOSIS — M25612 Stiffness of left shoulder, not elsewhere classified: Secondary | ICD-10-CM | POA: Diagnosis not present

## 2021-06-20 DIAGNOSIS — M6289 Other specified disorders of muscle: Secondary | ICD-10-CM | POA: Diagnosis not present

## 2021-06-26 DIAGNOSIS — M791 Myalgia, unspecified site: Secondary | ICD-10-CM | POA: Diagnosis not present

## 2021-06-26 DIAGNOSIS — M7918 Myalgia, other site: Secondary | ICD-10-CM | POA: Diagnosis not present

## 2021-06-26 DIAGNOSIS — M47812 Spondylosis without myelopathy or radiculopathy, cervical region: Secondary | ICD-10-CM | POA: Diagnosis not present

## 2021-06-27 DIAGNOSIS — M5412 Radiculopathy, cervical region: Secondary | ICD-10-CM | POA: Diagnosis not present

## 2021-06-27 DIAGNOSIS — Z9889 Other specified postprocedural states: Secondary | ICD-10-CM | POA: Diagnosis not present

## 2021-06-27 DIAGNOSIS — Z4789 Encounter for other orthopedic aftercare: Secondary | ICD-10-CM | POA: Diagnosis not present

## 2021-06-28 DIAGNOSIS — M5030 Other cervical disc degeneration, unspecified cervical region: Secondary | ICD-10-CM | POA: Diagnosis not present

## 2021-06-28 DIAGNOSIS — Z09 Encounter for follow-up examination after completed treatment for conditions other than malignant neoplasm: Secondary | ICD-10-CM | POA: Diagnosis not present

## 2021-06-28 DIAGNOSIS — Z981 Arthrodesis status: Secondary | ICD-10-CM | POA: Diagnosis not present

## 2021-06-28 DIAGNOSIS — M5412 Radiculopathy, cervical region: Secondary | ICD-10-CM | POA: Diagnosis not present

## 2021-07-01 DIAGNOSIS — M5412 Radiculopathy, cervical region: Secondary | ICD-10-CM | POA: Diagnosis not present

## 2021-07-01 DIAGNOSIS — Z9889 Other specified postprocedural states: Secondary | ICD-10-CM | POA: Diagnosis not present

## 2021-07-01 DIAGNOSIS — Z4789 Encounter for other orthopedic aftercare: Secondary | ICD-10-CM | POA: Diagnosis not present

## 2021-07-03 DIAGNOSIS — Z4789 Encounter for other orthopedic aftercare: Secondary | ICD-10-CM | POA: Diagnosis not present

## 2021-07-03 DIAGNOSIS — Z9889 Other specified postprocedural states: Secondary | ICD-10-CM | POA: Diagnosis not present

## 2021-07-03 DIAGNOSIS — M5412 Radiculopathy, cervical region: Secondary | ICD-10-CM | POA: Diagnosis not present

## 2021-07-08 DIAGNOSIS — M5412 Radiculopathy, cervical region: Secondary | ICD-10-CM | POA: Diagnosis not present

## 2021-07-08 DIAGNOSIS — Z9889 Other specified postprocedural states: Secondary | ICD-10-CM | POA: Diagnosis not present

## 2021-07-08 DIAGNOSIS — Z4789 Encounter for other orthopedic aftercare: Secondary | ICD-10-CM | POA: Diagnosis not present

## 2021-07-10 DIAGNOSIS — M5412 Radiculopathy, cervical region: Secondary | ICD-10-CM | POA: Diagnosis not present

## 2021-07-10 DIAGNOSIS — Z9889 Other specified postprocedural states: Secondary | ICD-10-CM | POA: Diagnosis not present

## 2021-07-10 DIAGNOSIS — Z4789 Encounter for other orthopedic aftercare: Secondary | ICD-10-CM | POA: Diagnosis not present

## 2021-07-15 DIAGNOSIS — Z4789 Encounter for other orthopedic aftercare: Secondary | ICD-10-CM | POA: Diagnosis not present

## 2021-07-15 DIAGNOSIS — Z9889 Other specified postprocedural states: Secondary | ICD-10-CM | POA: Diagnosis not present

## 2021-07-15 DIAGNOSIS — M5412 Radiculopathy, cervical region: Secondary | ICD-10-CM | POA: Diagnosis not present

## 2021-07-22 DIAGNOSIS — M25512 Pain in left shoulder: Secondary | ICD-10-CM | POA: Diagnosis not present

## 2021-07-22 DIAGNOSIS — Z4789 Encounter for other orthopedic aftercare: Secondary | ICD-10-CM | POA: Diagnosis not present

## 2021-07-22 DIAGNOSIS — Z981 Arthrodesis status: Secondary | ICD-10-CM | POA: Diagnosis not present

## 2021-07-22 DIAGNOSIS — M542 Cervicalgia: Secondary | ICD-10-CM | POA: Diagnosis not present

## 2021-07-26 DIAGNOSIS — N401 Enlarged prostate with lower urinary tract symptoms: Secondary | ICD-10-CM | POA: Diagnosis not present

## 2021-07-26 DIAGNOSIS — K22710 Barrett's esophagus with low grade dysplasia: Secondary | ICD-10-CM | POA: Diagnosis not present

## 2021-07-26 DIAGNOSIS — M5412 Radiculopathy, cervical region: Secondary | ICD-10-CM | POA: Diagnosis not present

## 2021-07-26 DIAGNOSIS — L570 Actinic keratosis: Secondary | ICD-10-CM | POA: Diagnosis not present

## 2021-07-26 DIAGNOSIS — E785 Hyperlipidemia, unspecified: Secondary | ICD-10-CM | POA: Diagnosis not present

## 2021-07-26 DIAGNOSIS — R351 Nocturia: Secondary | ICD-10-CM | POA: Diagnosis not present

## 2021-07-26 DIAGNOSIS — Z0000 Encounter for general adult medical examination without abnormal findings: Secondary | ICD-10-CM | POA: Diagnosis not present

## 2021-08-28 DIAGNOSIS — K635 Polyp of colon: Secondary | ICD-10-CM | POA: Diagnosis not present

## 2021-08-28 DIAGNOSIS — D123 Benign neoplasm of transverse colon: Secondary | ICD-10-CM | POA: Diagnosis not present

## 2021-08-28 DIAGNOSIS — Z1211 Encounter for screening for malignant neoplasm of colon: Secondary | ICD-10-CM | POA: Diagnosis not present

## 2021-09-02 DIAGNOSIS — L821 Other seborrheic keratosis: Secondary | ICD-10-CM | POA: Diagnosis not present

## 2021-09-02 DIAGNOSIS — D225 Melanocytic nevi of trunk: Secondary | ICD-10-CM | POA: Diagnosis not present

## 2021-09-02 DIAGNOSIS — L570 Actinic keratosis: Secondary | ICD-10-CM | POA: Diagnosis not present

## 2021-09-02 DIAGNOSIS — S90861A Insect bite (nonvenomous), right foot, initial encounter: Secondary | ICD-10-CM | POA: Diagnosis not present

## 2021-09-02 DIAGNOSIS — L720 Epidermal cyst: Secondary | ICD-10-CM | POA: Diagnosis not present

## 2021-09-02 DIAGNOSIS — L579 Skin changes due to chronic exposure to nonionizing radiation, unspecified: Secondary | ICD-10-CM | POA: Diagnosis not present

## 2021-09-02 DIAGNOSIS — L814 Other melanin hyperpigmentation: Secondary | ICD-10-CM | POA: Diagnosis not present

## 2021-09-02 DIAGNOSIS — L820 Inflamed seborrheic keratosis: Secondary | ICD-10-CM | POA: Diagnosis not present

## 2021-09-03 DIAGNOSIS — D123 Benign neoplasm of transverse colon: Secondary | ICD-10-CM | POA: Diagnosis not present

## 2021-09-24 DIAGNOSIS — R52 Pain, unspecified: Secondary | ICD-10-CM | POA: Diagnosis not present

## 2021-09-24 DIAGNOSIS — L570 Actinic keratosis: Secondary | ICD-10-CM | POA: Diagnosis not present

## 2021-10-16 DIAGNOSIS — L570 Actinic keratosis: Secondary | ICD-10-CM | POA: Diagnosis not present

## 2021-10-16 DIAGNOSIS — R52 Pain, unspecified: Secondary | ICD-10-CM | POA: Diagnosis not present

## 2021-11-07 DIAGNOSIS — L570 Actinic keratosis: Secondary | ICD-10-CM | POA: Diagnosis not present

## 2021-11-07 DIAGNOSIS — R52 Pain, unspecified: Secondary | ICD-10-CM | POA: Diagnosis not present

## 2021-12-09 DIAGNOSIS — L579 Skin changes due to chronic exposure to nonionizing radiation, unspecified: Secondary | ICD-10-CM | POA: Diagnosis not present

## 2021-12-09 DIAGNOSIS — L570 Actinic keratosis: Secondary | ICD-10-CM | POA: Diagnosis not present

## 2021-12-09 DIAGNOSIS — L814 Other melanin hyperpigmentation: Secondary | ICD-10-CM | POA: Diagnosis not present

## 2022-01-28 DIAGNOSIS — E785 Hyperlipidemia, unspecified: Secondary | ICD-10-CM | POA: Diagnosis not present

## 2022-02-10 DIAGNOSIS — M5030 Other cervical disc degeneration, unspecified cervical region: Secondary | ICD-10-CM | POA: Diagnosis not present

## 2022-02-10 DIAGNOSIS — M47812 Spondylosis without myelopathy or radiculopathy, cervical region: Secondary | ICD-10-CM | POA: Diagnosis not present

## 2022-02-26 DIAGNOSIS — J329 Chronic sinusitis, unspecified: Secondary | ICD-10-CM | POA: Diagnosis not present

## 2022-04-24 DIAGNOSIS — M19011 Primary osteoarthritis, right shoulder: Secondary | ICD-10-CM | POA: Diagnosis not present

## 2022-04-24 DIAGNOSIS — M1711 Unilateral primary osteoarthritis, right knee: Secondary | ICD-10-CM | POA: Diagnosis not present

## 2022-04-24 DIAGNOSIS — G2589 Other specified extrapyramidal and movement disorders: Secondary | ICD-10-CM | POA: Diagnosis not present

## 2022-04-24 DIAGNOSIS — R52 Pain, unspecified: Secondary | ICD-10-CM | POA: Diagnosis not present

## 2022-04-24 DIAGNOSIS — S8001XA Contusion of right knee, initial encounter: Secondary | ICD-10-CM | POA: Diagnosis not present

## 2022-04-24 DIAGNOSIS — S29012A Strain of muscle and tendon of back wall of thorax, initial encounter: Secondary | ICD-10-CM | POA: Diagnosis not present

## 2022-04-25 DIAGNOSIS — R2689 Other abnormalities of gait and mobility: Secondary | ICD-10-CM | POA: Diagnosis not present

## 2022-04-25 DIAGNOSIS — M546 Pain in thoracic spine: Secondary | ICD-10-CM | POA: Diagnosis not present

## 2022-04-25 DIAGNOSIS — S29012D Strain of muscle and tendon of back wall of thorax, subsequent encounter: Secondary | ICD-10-CM | POA: Diagnosis not present

## 2022-05-06 DIAGNOSIS — M6281 Muscle weakness (generalized): Secondary | ICD-10-CM | POA: Diagnosis not present

## 2022-05-06 DIAGNOSIS — R6889 Other general symptoms and signs: Secondary | ICD-10-CM | POA: Diagnosis not present

## 2022-05-06 DIAGNOSIS — R2689 Other abnormalities of gait and mobility: Secondary | ICD-10-CM | POA: Diagnosis not present

## 2022-05-09 DIAGNOSIS — M6281 Muscle weakness (generalized): Secondary | ICD-10-CM | POA: Diagnosis not present

## 2022-05-09 DIAGNOSIS — R6889 Other general symptoms and signs: Secondary | ICD-10-CM | POA: Diagnosis not present

## 2022-05-09 DIAGNOSIS — R2689 Other abnormalities of gait and mobility: Secondary | ICD-10-CM | POA: Diagnosis not present

## 2022-05-12 DIAGNOSIS — R2689 Other abnormalities of gait and mobility: Secondary | ICD-10-CM | POA: Diagnosis not present

## 2022-05-12 DIAGNOSIS — R6889 Other general symptoms and signs: Secondary | ICD-10-CM | POA: Diagnosis not present

## 2022-05-12 DIAGNOSIS — M6281 Muscle weakness (generalized): Secondary | ICD-10-CM | POA: Diagnosis not present

## 2022-05-20 DIAGNOSIS — M6281 Muscle weakness (generalized): Secondary | ICD-10-CM | POA: Diagnosis not present

## 2022-05-20 DIAGNOSIS — R2689 Other abnormalities of gait and mobility: Secondary | ICD-10-CM | POA: Diagnosis not present

## 2022-05-20 DIAGNOSIS — R6889 Other general symptoms and signs: Secondary | ICD-10-CM | POA: Diagnosis not present

## 2022-05-22 DIAGNOSIS — M542 Cervicalgia: Secondary | ICD-10-CM | POA: Diagnosis not present

## 2022-05-22 DIAGNOSIS — Z7409 Other reduced mobility: Secondary | ICD-10-CM | POA: Diagnosis not present

## 2022-05-22 DIAGNOSIS — S29012D Strain of muscle and tendon of back wall of thorax, subsequent encounter: Secondary | ICD-10-CM | POA: Diagnosis not present

## 2022-05-26 DIAGNOSIS — M6281 Muscle weakness (generalized): Secondary | ICD-10-CM | POA: Diagnosis not present

## 2022-05-26 DIAGNOSIS — S29012D Strain of muscle and tendon of back wall of thorax, subsequent encounter: Secondary | ICD-10-CM | POA: Diagnosis not present

## 2022-05-26 DIAGNOSIS — M6289 Other specified disorders of muscle: Secondary | ICD-10-CM | POA: Diagnosis not present

## 2022-05-26 DIAGNOSIS — R29898 Other symptoms and signs involving the musculoskeletal system: Secondary | ICD-10-CM | POA: Diagnosis not present

## 2022-05-26 DIAGNOSIS — R6889 Other general symptoms and signs: Secondary | ICD-10-CM | POA: Diagnosis not present

## 2022-07-07 DIAGNOSIS — F4323 Adjustment disorder with mixed anxiety and depressed mood: Secondary | ICD-10-CM | POA: Diagnosis not present

## 2022-07-09 DIAGNOSIS — F4323 Adjustment disorder with mixed anxiety and depressed mood: Secondary | ICD-10-CM | POA: Diagnosis not present

## 2022-07-28 DIAGNOSIS — N401 Enlarged prostate with lower urinary tract symptoms: Secondary | ICD-10-CM | POA: Diagnosis not present

## 2022-07-28 DIAGNOSIS — F4323 Adjustment disorder with mixed anxiety and depressed mood: Secondary | ICD-10-CM | POA: Diagnosis not present

## 2022-07-28 DIAGNOSIS — K219 Gastro-esophageal reflux disease without esophagitis: Secondary | ICD-10-CM | POA: Diagnosis not present

## 2022-07-28 DIAGNOSIS — R351 Nocturia: Secondary | ICD-10-CM | POA: Diagnosis not present

## 2022-07-28 DIAGNOSIS — E785 Hyperlipidemia, unspecified: Secondary | ICD-10-CM | POA: Diagnosis not present

## 2022-08-01 DIAGNOSIS — E785 Hyperlipidemia, unspecified: Secondary | ICD-10-CM | POA: Diagnosis not present

## 2022-08-01 DIAGNOSIS — K219 Gastro-esophageal reflux disease without esophagitis: Secondary | ICD-10-CM | POA: Diagnosis not present

## 2022-08-01 DIAGNOSIS — N401 Enlarged prostate with lower urinary tract symptoms: Secondary | ICD-10-CM | POA: Diagnosis not present

## 2022-08-01 DIAGNOSIS — R351 Nocturia: Secondary | ICD-10-CM | POA: Diagnosis not present

## 2022-08-01 DIAGNOSIS — K22710 Barrett's esophagus with low grade dysplasia: Secondary | ICD-10-CM | POA: Diagnosis not present

## 2022-08-01 DIAGNOSIS — Z0000 Encounter for general adult medical examination without abnormal findings: Secondary | ICD-10-CM | POA: Diagnosis not present

## 2022-08-06 DIAGNOSIS — F4323 Adjustment disorder with mixed anxiety and depressed mood: Secondary | ICD-10-CM | POA: Diagnosis not present

## 2022-08-20 DIAGNOSIS — F4323 Adjustment disorder with mixed anxiety and depressed mood: Secondary | ICD-10-CM | POA: Diagnosis not present
